# Patient Record
Sex: Male | Born: 1985 | Race: Black or African American | Hispanic: No | Marital: Single | Smoking: Current some day smoker
Health system: Southern US, Community
[De-identification: ages and names within clinical notes are randomized; demographics above are authoritative.]

## PROBLEM LIST (undated history)

## (undated) DIAGNOSIS — J45909 Unspecified asthma, uncomplicated: Secondary | ICD-10-CM

## (undated) DIAGNOSIS — I639 Cerebral infarction, unspecified: Secondary | ICD-10-CM

---
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
## 2004-05-20 ENCOUNTER — Emergency Department (HOSPITAL_COMMUNITY): Admission: EM | Admit: 2004-05-20 | Discharge: 2004-05-20 | Payer: Self-pay | Admitting: Emergency Medicine

## 2010-12-22 ENCOUNTER — Emergency Department (HOSPITAL_COMMUNITY)
Admission: EM | Admit: 2010-12-22 | Discharge: 2010-12-22 | Discharge status: Against Medical Advice | Payer: Self-pay | Attending: Emergency Medicine | Admitting: Emergency Medicine

## 2010-12-22 DIAGNOSIS — R51 Headache: Secondary | ICD-10-CM | POA: Insufficient documentation

## 2011-05-29 ENCOUNTER — Emergency Department (HOSPITAL_COMMUNITY)
Admission: EM | Admit: 2011-05-29 | Discharge: 2011-05-29 | Disposition: A | Discharge status: Home / Self Care | Payer: Self-pay | Attending: Emergency Medicine | Admitting: Emergency Medicine

## 2011-05-29 ENCOUNTER — Encounter: Payer: Self-pay | Admitting: *Deleted

## 2011-05-29 DIAGNOSIS — J111 Influenza due to unidentified influenza virus with other respiratory manifestations: Secondary | ICD-10-CM | POA: Insufficient documentation

## 2011-05-29 DIAGNOSIS — R059 Cough, unspecified: Secondary | ICD-10-CM | POA: Insufficient documentation

## 2011-05-29 DIAGNOSIS — R51 Headache: Secondary | ICD-10-CM | POA: Insufficient documentation

## 2011-05-29 DIAGNOSIS — R05 Cough: Secondary | ICD-10-CM | POA: Insufficient documentation

## 2011-05-29 DIAGNOSIS — R5383 Other fatigue: Secondary | ICD-10-CM | POA: Insufficient documentation

## 2011-05-29 DIAGNOSIS — R5381 Other malaise: Secondary | ICD-10-CM | POA: Insufficient documentation

## 2011-05-29 DIAGNOSIS — R112 Nausea with vomiting, unspecified: Secondary | ICD-10-CM | POA: Insufficient documentation

## 2011-05-29 DIAGNOSIS — R6883 Chills (without fever): Secondary | ICD-10-CM | POA: Insufficient documentation

## 2011-05-29 MED ORDER — ONDANSETRON 4 MG PO TBDP
4.0000 mg | ORAL_TABLET | Freq: Once | ORAL | Status: AC
  Administered 2011-05-29: 4 mg via ORAL
  Filled 2011-05-29: qty 1

## 2011-05-29 NOTE — ED Notes (Signed)
PT reports flu SX's today , N/V and weakness.

## 2011-05-29 NOTE — ED Notes (Signed)
Pt stated understanding of discharge instructions.

## 2011-05-29 NOTE — ED Provider Notes (Signed)
History     CSN: [id number]  Arrival date & time 05/29/11  1859   First MD Initiated Contact with Patient 05/29/11 2118      Chief Complaint  Patient presents with  . Influenza    N/V    (Consider location/radiation/quality/duration/timing/severity/associated sxs/prior treatment) HPI Comments: See down times daily, and rotation  Patient is a 26 y.o. male presenting with flu symptoms. The history is provided by the patient.  Influenza The current episode started in the past 7 days.    History reviewed. No pertinent past medical history.  History reviewed. No pertinent past surgical history.  History reviewed. No pertinent family history.  History  Substance Use Topics  . Smoking status: Not on file  . Smokeless tobacco: Not on file  . Alcohol Use: Not on file      Review of Systems  Allergies  Review of patient's allergies indicates no known allergies.  Home Medications   Current Outpatient Rx  Name Route Sig Dispense Refill  . ASPIRIN EC 81 MG PO TBEC Oral Take 324 mg by mouth once as needed. For pain       BP 124/78  Pulse 102  Temp(Src) 99.8 F (37.7 C) (Oral)  Resp 22  SpO2 96%  Physical Exam  ED Course  Procedures (including critical care time)  Labs Reviewed - No data to display No results found.   1. Influenza       MDM  Flu like sx        Arman Filter, NP 05/31/11 1439  Arman Filter, NP 05/31/11 1440

## 2011-05-29 NOTE — ED Notes (Signed)
Pt c/o flu like symptoms productive cough over the last week, multiple N/V today. Chills, and throbbing  HA

## 2011-06-01 NOTE — ED Provider Notes (Signed)
Medical screening examination/treatment/procedure(s) were performed by non-physician practitioner and as supervising physician I was immediately available for consultation/collaboration.    Efrat Zuidema L Torre [place], MD 06/01/11 0559 

## 2012-10-01 ENCOUNTER — Encounter (HOSPITAL_COMMUNITY): Payer: Self-pay | Admitting: Emergency Medicine

## 2012-10-01 ENCOUNTER — Emergency Department (HOSPITAL_COMMUNITY)
Admission: EM | Admit: 2012-10-01 | Discharge: 2012-10-01 | Disposition: A | Discharge status: Home / Self Care | Payer: Self-pay | Attending: Emergency Medicine | Admitting: Emergency Medicine

## 2012-10-01 ENCOUNTER — Emergency Department (HOSPITAL_COMMUNITY): Payer: Self-pay

## 2012-10-01 DIAGNOSIS — J45909 Unspecified asthma, uncomplicated: Secondary | ICD-10-CM | POA: Insufficient documentation

## 2012-10-01 DIAGNOSIS — S63259A Unspecified dislocation of unspecified finger, initial encounter: Secondary | ICD-10-CM | POA: Insufficient documentation

## 2012-10-01 DIAGNOSIS — W219XXA Striking against or struck by unspecified sports equipment, initial encounter: Secondary | ICD-10-CM | POA: Insufficient documentation

## 2012-10-01 DIAGNOSIS — F17200 Nicotine dependence, unspecified, uncomplicated: Secondary | ICD-10-CM | POA: Insufficient documentation

## 2012-10-01 DIAGNOSIS — Y9367 Activity, basketball: Secondary | ICD-10-CM | POA: Insufficient documentation

## 2012-10-01 DIAGNOSIS — Y929 Unspecified place or not applicable: Secondary | ICD-10-CM | POA: Insufficient documentation

## 2012-10-01 HISTORY — DX: Unspecified asthma, uncomplicated: J45.909

## 2012-10-01 MED ORDER — OXYCODONE-ACETAMINOPHEN 5-325 MG PO TABS: 1.0000 | ORAL_TABLET | Freq: Four times a day (QID) | ORAL | Status: DC | PRN

## 2012-10-01 NOTE — ED Notes (Signed)
Pt called in main ED waiting area with no response 

## 2012-10-01 NOTE — ED Notes (Signed)
Was playing basketball yesterday and injured  Rt pinky finger

## 2012-10-01 NOTE — ED Notes (Signed)
Pt st's he was playing basketball yesterday and ball hit his right little finger.  St's finger was out to side but he was able to get it back in place.  Now st's finger is swollen and painful.

## 2012-10-01 NOTE — Progress Notes (Signed)
Orthopedic Tech Progress Note Patient Details:  Robert Ibarra [birth year] [id number]  Ortho Devices Type of Ortho Device: Finger splint Ortho Device/Splint Location: RUE Ortho Device/Splint Interventions: Ordered;Application   [name] 10/01/2012, 4:56 PM

## 2012-10-01 NOTE — ED Provider Notes (Signed)
History  This chart was scribed for [employer]. Rubin Payor, MD by Ardeen Jourdain, ED Scribe. This patient was seen in room TR11C/TR11C and the patient's care was started at 1613.  CSN: [id number]  Arrival date & time 10/01/12  1426   First MD Initiated Contact with Patient 10/01/12 1613      Chief Complaint  Patient presents with  . Finger Injury     The history is provided by the patient. No language interpreter was used.    HPI Comments: Robert Ibarra is a 27 y.o. male who presents to the Emergency Department complaining of a finger injury that occurred yesterday. He states he has gradually worsening, constant pain to his right pinky finger. He states he was playing basketball when his "finger got stuck behind the ball." He states the finger has been "popping in and out of its socket." He denies any previous injury or similar symptoms. He denies any other symptoms at this time.   Past Medical History  Diagnosis Date  . Asthma     No past surgical history on file.  No family history on file.  History  Substance Use Topics  . Smoking status: Current Every Day Smoker  . Smokeless tobacco: Not on file  . Alcohol Use: Yes      Review of Systems  Musculoskeletal: Positive for arthralgias.       Right pinky pain  All other systems reviewed and are negative.    Allergies  Review of patient's allergies indicates no known allergies.  Home Medications   Current Outpatient Rx  Name  Route  Sig  Dispense  Refill  . acetaminophen (TYLENOL) 325 MG tablet   Oral   Take 650 mg by mouth once.         Marland Kitchen ibuprofen (ADVIL,MOTRIN) 200 MG tablet   Oral   Take 400 mg by mouth once.         Marland Kitchen oxyCODONE-acetaminophen (PERCOCET/ROXICET) 5-325 MG per tablet   Oral   Take 1-2 tablets by mouth every 6 (six) hours as needed for pain.   10 tablet   0     Triage Vitals: BP 112/67  Pulse 69  Temp(Src) 98.6 F (37 C)  Resp 16  SpO2 99%  Physical Exam  Nursing note and  vitals reviewed. Constitutional: He is oriented to person, place, and time. He appears well-developed and well-nourished. No distress.  HENT:  Head: Normocephalic and atraumatic.  Eyes: EOM are normal. Pupils are equal, round, and reactive to light.  Neck: Normal range of motion. Neck supple. No tracheal deviation present.  Cardiovascular: Normal rate.   Pulmonary/Chest: Effort normal. No respiratory distress.  Abdominal: Soft. He exhibits no distension.  Musculoskeletal: Normal range of motion. He exhibits no edema.  Laxity with medial flexion at PIP joint of right pinky finger. Sensation intact. Mild swelling to pinky finger. Normal capillary refill.   Neurological: He is alert and oriented to person, place, and time.  Skin: Skin is warm and dry.  Psychiatric: He has a normal mood and affect. His behavior is normal.    ED Course  Procedures (including critical care time)  4:17 PM-Discussed treatment plan which includes x-ray of right pinky finger and follow up with an orthopedist with pt at bedside and pt agreed to plan.    Labs Reviewed - No data to display Dg Finger Little Right  10/01/2012  *RADIOLOGY REPORT*  Clinical Data: Hit finger playing basketball, injury  RIGHT LITTLE FINGER 2+V  Comparison: None  Findings: Joint spaces preserved. Osseous mineralization normal. No acute fracture, dislocation or bone destruction.  IMPRESSION: No acute osseous abnormalities.   Original Report Authenticated By: Ulyses Southward, M.D.      1. Finger dislocation, initial encounter       MDM  Patient is finger injury. Likely was the dislocation. States that it has been planning or on direction and 1 point back to. X-ray does not show fracture. Patient was splinted and will followup with hand surgery.      I personally performed the services described in this documentation, which was scribed in my presence. The recorded information has been reviewed and is accurate.    Juliet Rude. Rubin Payor,  MD 10/01/12 2332

## 2013-08-13 ENCOUNTER — Encounter (HOSPITAL_COMMUNITY): Payer: Self-pay | Admitting: Emergency Medicine

## 2013-08-13 ENCOUNTER — Emergency Department (HOSPITAL_COMMUNITY): Payer: Self-pay

## 2013-08-13 ENCOUNTER — Emergency Department (HOSPITAL_COMMUNITY)
Admission: EM | Admit: 2013-08-13 | Discharge: 2013-08-13 | Disposition: A | Discharge status: Home / Self Care | Payer: Self-pay | Attending: Emergency Medicine | Admitting: Emergency Medicine

## 2013-08-13 DIAGNOSIS — F17200 Nicotine dependence, unspecified, uncomplicated: Secondary | ICD-10-CM | POA: Insufficient documentation

## 2013-08-13 DIAGNOSIS — S61212A Laceration without foreign body of right middle finger without damage to nail, initial encounter: Secondary | ICD-10-CM

## 2013-08-13 DIAGNOSIS — Z23 Encounter for immunization: Secondary | ICD-10-CM | POA: Insufficient documentation

## 2013-08-13 DIAGNOSIS — Y929 Unspecified place or not applicable: Secondary | ICD-10-CM | POA: Insufficient documentation

## 2013-08-13 DIAGNOSIS — W268XXA Contact with other sharp object(s), not elsewhere classified, initial encounter: Secondary | ICD-10-CM | POA: Insufficient documentation

## 2013-08-13 DIAGNOSIS — Y9389 Activity, other specified: Secondary | ICD-10-CM | POA: Insufficient documentation

## 2013-08-13 DIAGNOSIS — S61209A Unspecified open wound of unspecified finger without damage to nail, initial encounter: Secondary | ICD-10-CM | POA: Insufficient documentation

## 2013-08-13 DIAGNOSIS — J45909 Unspecified asthma, uncomplicated: Secondary | ICD-10-CM | POA: Insufficient documentation

## 2013-08-13 MED ORDER — TETANUS-DIPHTH-ACELL PERTUSSIS 5-2.5-18.5 LF-MCG/0.5 IM SUSP
0.5000 mL | Freq: Once | INTRAMUSCULAR | Status: AC
  Administered 2013-08-13: 0.5 mL via INTRAMUSCULAR
  Filled 2013-08-13: qty 0.5

## 2013-08-13 MED ORDER — IBUPROFEN 400 MG PO TABS
800.0000 mg | ORAL_TABLET | Freq: Once | ORAL | Status: AC
  Administered 2013-08-13: 800 mg via ORAL
  Filled 2013-08-13: qty 2

## 2013-08-13 NOTE — Discharge Instructions (Signed)
Keep wounds clean and dry  Apply antibiotic ointment  Return to the emergency department if you develop any changing/worsening condition, drainage, spreading redness/swelling, increased pain, fever or any other concerns (please read additional information regarding your condition below)    Laceration Care, Adult A laceration is a cut or lesion that goes through all layers of the skin and into the tissue just beneath the skin. TREATMENT  Some lacerations may not require closure. Some lacerations may not be able to be closed due to an increased risk of infection. It is important to see your caregiver as soon as possible after an injury to minimize the risk of infection and maximize the opportunity for successful closure. If closure is appropriate, pain medicines may be given, if needed. The wound will be cleaned to help prevent infection. Your caregiver will use stitches (sutures), staples, wound glue (adhesive), or skin adhesive strips to repair the laceration. These tools bring the skin edges together to allow for faster healing and a better cosmetic outcome. However, all wounds will heal with a scar. Once the wound has healed, scarring can be minimized by covering the wound with sunscreen during the day for 1 full year. HOME CARE INSTRUCTIONS  For sutures or staples:  Keep the wound clean and dry.  If you were given a bandage (dressing), you should change it at least once a day. Also, change the dressing if it becomes wet or dirty, or as directed by your caregiver.  Wash the wound with soap and water 2 times a day. Rinse the wound off with water to remove all soap. Pat the wound dry with a clean towel.  After cleaning, apply a thin layer of the antibiotic ointment as recommended by your caregiver. This will help prevent infection and keep the dressing from sticking.  You may shower as usual after the first 24 hours. Do not soak the wound in water until the sutures are removed.  Only take  over-the-counter or prescription medicines for pain, discomfort, or fever as directed by your caregiver.  Get your sutures or staples removed as directed by your caregiver. For skin adhesive strips:  Keep the wound clean and dry.  Do not get the skin adhesive strips wet. You may bathe carefully, using caution to keep the wound dry.  If the wound gets wet, pat it dry with a clean towel.  Skin adhesive strips will fall off on their own. You may trim the strips as the wound heals. Do not remove skin adhesive strips that are still stuck to the wound. They will fall off in time. For wound adhesive:  You may briefly wet your wound in the shower or bath. Do not soak or scrub the wound. Do not swim. Avoid periods of heavy perspiration until the skin adhesive has fallen off on its own. After showering or bathing, gently pat the wound dry with a clean towel.  Do not apply liquid medicine, cream medicine, or ointment medicine to your wound while the skin adhesive is in place. This may loosen the film before your wound is healed.  If a dressing is placed over the wound, be careful not to apply tape directly over the skin adhesive. This may cause the adhesive to be pulled off before the wound is healed.  Avoid prolonged exposure to sunlight or tanning lamps while the skin adhesive is in place. Exposure to ultraviolet light in the first year will darken the scar.  The skin adhesive will usually remain in place for  5 to 10 days, then naturally fall off the skin. Do not pick at the adhesive film. You may need a tetanus shot if:  You cannot remember when you had your last tetanus shot.  You have never had a tetanus shot. If you get a tetanus shot, your arm may swell, get red, and feel warm to the touch. This is common and not a problem. If you need a tetanus shot and you choose not to have one, there is a rare chance of getting tetanus. Sickness from tetanus can be serious. SEEK MEDICAL CARE IF:   You  have redness, swelling, or increasing pain in the wound.  You see a red line that goes away from the wound.  You have yellowish-white fluid (pus) coming from the wound.  You have a fever.  You notice a bad smell coming from the wound or dressing.  Your wound breaks open before or after sutures have been removed.  You notice something coming out of the wound such as wood or glass.  Your wound is on your hand or foot and you cannot move a finger or toe. SEEK IMMEDIATE MEDICAL CARE IF:   Your pain is not controlled with prescribed medicine.  You have severe swelling around the wound causing pain and numbness or a change in color in your arm, hand, leg, or foot.  Your wound splits open and starts bleeding.  You have worsening numbness, weakness, or loss of function of any joint around or beyond the wound.  You develop painful lumps near the wound or on the skin anywhere on your body. MAKE SURE YOU:   Understand these instructions.  Will watch your condition.  Will get help right away if you are not doing well or get worse. Document Released: 05/09/2005 Document Revised: 08/01/2011 Document Reviewed: 11/02/2010 [facility] Patient Information 2014 [place], [place].   Emergency Department Resource Guide 1) Find a Doctor and Pay Out of Pocket Although you won't have to find out who is covered by your insurance plan, it is a good idea to ask around and get recommendations. You will then need to call the office and see if the doctor you have chosen will accept you as a new patient and what types of options they offer for patients who are self-pay. Some doctors offer discounts or will set up payment plans for their patients who do not have insurance, but you will need to ask so you aren't surprised when you get to your appointment.  2) Contact Your Local Health Department Not all health departments have doctors that can see patients for sick visits, but many do, so it is worth a call to  see if yours does. If you don't know where your local health department is, you can check in your phone book. The CDC also has a tool to help you locate your state's health department, and many state websites also have listings of all of their local health departments.  3) Find a Walk-in Clinic If your illness is not likely to be very severe or complicated, you may want to try a walk in clinic. These are popping up all over the country in pharmacies, drugstores, and shopping centers. They're usually staffed by nurse practitioners or physician assistants that have been trained to treat common illnesses and complaints. They're usually fairly quick and inexpensive. However, if you have serious medical issues or chronic medical problems, these are probably not your best option.  No Primary Care Doctor: - Call Health Connect  at  [phone] - they can help you locate a primary care doctor that  accepts your insurance, provides certain services, etc. - Physician Referral Service- [phone]  Chronic Pain Problems: Organization         Address  Phone   Notes  [facility]  [phone] Patients need to be referred by their primary care doctor.   Medication Assistance: Organization         Address  Phone   Notes  [facility] Medication [facility] [address] [place] [place], [place] [address] [phone] --Must be a resident of [facility] -- Must have NO insurance coverage whatsoever (no Medicaid/ Medicare, etc.) -- The pt. MUST have a primary care doctor that directs their care regularly and follows them in the community   [facility]  [phone]   [employer]  [phone]    Agencies that provide inexpensive medical care: Organization         Address  Phone   Notes  [facility]  [phone]   [facility]    [phone]   [facility] [address] [place], [place] [address] [phone]   [facility] [address] [place], [place] [phone]   [facility]anned Parenthood    [phone]   [facility]    [phone]   [facility]  [address], [place] Phone:  [phone], Fax:  [phone] Hours of Operation:  9 am - 6 pm, M-F.  Also accepts Medicaid/Medicare and self-pay.  [facility]  [address], [place] Phone: [phone], Fax: [phone]. Hours of Operation:  8:30 am - 5:30 pm, M-F.  Also accepts Medicaid and self-pay.  [facility] [place] [address], [place] Phone: [phone]   [facility] [address] Natasha Bence [place], [place] [phone], Ext. [phone] Mondays & Thursdays: 7-9 AM.  First 15 patients are seen on a first come, first serve basis.    Medicaid-accepting [facility] Providers:  Organization         Address  Phone   Notes  [facility] [address], [place] [phone] Also accepts self-pay patients.  [facility] [address] Laurell Josephs [place], [place]  [phone]   [facility] [address], [place] [phone]   [facility] [address] [place] [phone]   Renaye Rakers [address], [place]   [phone] Only accepts [place] Access [place] patients after they have their name applied to their card.   Self-Pay (no insurance) in [facility]:  Organization         Address  Phone   Notes  Sickle Cell Patients, [facility] Medicine [address] [place], [place] [phone]   [facility] [address] [place], [place] [phone]   [facility] [place]  [address], [place] [phone]   [facility]/Dr. Osei-Bonsu  [address], [employer] or [address], [place] [phone] Phone number for both [place] and [place] locations is the same.  [facility] [address] Ginette Otto [phone]   [facility] [address],  or [address] [phone]   [facility] [address] [place], [place] [phone], phone; [phone], fax Sees patients 1st and 3rd Saturday of every month.  Must not qualify for public or private insurance (i.e. Medicaid, Medicare, [place] Health Choice, Veterans' Benefits)  Household income should be no more than 200% of the poverty level The clinic cannot treat you if you are pregnant or think you are pregnant  Sexually transmitted diseases are not treated at the clinic.    Dental Care: Organization         Address  Phone  Notes  [facility] Department of [facility] [address] [place], [place] [phone] Accepts children up to age 80 who are enrolled in [place] or [place] Health Choice; pregnant women with a Medicaid card; and children who have applied for Medicaid or [place] Health Choice, but were declined, whose parents can pay a reduced fee at time of service.  [facility] Department of [facility]  [address], [place] [phone] Accepts children up to age 74 who are enrolled in [place] or [place] Health Choice; pregnant women with a Medicaid card; and children who have applied for Medicaid or  Health Choice, but were declined, whose parents can pay a reduced fee at time of service.  [facility]  [address] [place], [place] [phone] Patients are seen by appointment only. Walk-ins are not accepted. [facility] will see patients 23 years of age and older. Monday - Tuesday (8am-5pm) Most Wednesdays (8:30-5pm) $30 per visit, cash only  [facility] Adult [facility] Access [facility]  [address], [facility] [phone] Patients are  seen by appointment only. Walk-ins are not accepted. [facility] will see patients 75 years of age and older. One Wednesday Evening (Monthly: Volunteer Based).  $30 per visit, cash only  [employer]  [phone] for adults; Children under age 90, call Graduate Pediatric Dentistry at [phone]. Children aged 61-14, please call [phone] to request a pediatric application.  Dental services are provided in all areas of dental care including fillings, crowns and bridges, complete and partial dentures, implants, gum treatment, root canals, and extractions. Preventive care is also provided. Treatment is provided to both adults and children. Patients are selected via a lottery and there is often a waiting list.   [facility] [address] [place]  [phone] [email or web address]   [facility] [address] [place], [place] [phone], Ext. [phone] Second and Fourth Thursday of each month, opens at 6:30 AM; Clinic ends at 9 AM.  Patients are seen on a first-come first-served basis, and a limited number are seen during each clinic.   [facility]  [address] Ether Griffins [place], [place] [phone]   Eligibility Requirements You must have lived in [place], [place], or [place] counties for at least the last three months.   You cannot be eligible for state or federal sponsored [place], including [employer], [place], or [employer].   You generally cannot be eligible for healthcare insurance through your employer.    How to apply: Eligibility screenings are held every Tuesday and Wednesday afternoon from 1:00 pm until 4:00 pm. You do not need an appointment for the interview!  [facility] [address], [place], [place] [phone]   [facility] Department  [phone]  [facility] Health Department  [phone]   [facility] Health Department  [phone]     Behavioral Health Resources in the Community: Intensive Outpatient Programs Organization         Address  Phone  Notes  [facility] [place] [facility] [address], [place], [place] [phone]   [facility] Outpatient [address] [place], [place] [phone]   [facility] [address] [place], [place]  [phone]   [facility] [address],  [place], [place] [phone] or [phone]   Substance Abuse Resources Organization         Address  Phone  Notes  [facility]  [phone]   [facility]  [phone]   [facility]  [phone]   Floydene Flock  [phone]   Residential & Outpatient Substance Abuse Program  [phone]   Psychological Services Organization         Address  Phone  Notes  [facility]  [phone]   [facility] Services  [phone]   [facility] Mental Health [address] [place] [phone] or [phone]    Mobile Crisis Teams Organization         Address  Phone  Notes  [facility], Mobile Crisis Care Unit  [phone]   [facility]  [address] [place], [place] [phone]   Doristine Locks [address] [place] [phone]    Self-Help/Support Groups Organization         Address  Phone             Notes  [facility] [place] - variety of support groups  336- I7437963 Call for more information  Narcotics Anonymous (NA), [facility] [address], [employer] [place]  2 meetings at this location   Statistician         Address  Phone  Notes  [facility] [address],    [place]  [phone]   [facility]  [address], [place] [address], [place], [place] [phone]   [facility] [address] [place], [place] [phone] Admissions: 8am-3pm M-F  Incentives  [facility] Treatment Center [address]    [address], [place] [phone]   [facility] [address] [place], [place], [place] [phone]   [facility] [address],  [place], [place] [phone]   [facility] - Intensive Outpatient [address] Laurell Joseph[address], [place], [place] [phone]   [facility] (Addiction [facility] [address] [place]  [place], [place] [phone] or [phone]   [facility] ([facility]) [address], [place], [place] [phone] Accepts Medicaid  [facility] [address]  [place] [phone] Substance Abuse/Addiction Treatment   [facility] Organization         Address  Phone  Notes  [facility]  [phone]   Angie Fava, PhD [address] Ervin Knack [place], [place]   [phone] or [phone]   [facility] Behavioral   [address] [place], [place] [phone]   [facility] [address], [place], [place] [phone] Insurance/Medicaid/sponsorship through [employer] and Families [address]  [place], [place] [phone] [place], [place] [phone]    Dr. Adele Schilder  [phone]   [facility] Dept. 1) [address] [place] 2) [place] 3)  [address], [place] [phone]  [phone]   [place] [phone] or [phone] (After Hours)

## 2013-08-13 NOTE — ED Notes (Signed)
J.P. At bedside, 2 sutures applied to finger.

## 2013-08-13 NOTE — ED Provider Notes (Signed)
CSN: [id number]     Arrival date & time 08/13/13  1345 History   None   This chart was scribed for Junius Argyle PA-C, a non-physician practitioner working with Donnetta Hutching, MD by Lewanda Rife, ED Scribe. This patient was seen in room TR10C/TR10C and the patient's care was started at 1:58 PM    Chief Complaint  Patient presents with  . Laceration    The history is provided by the patient. No language interpreter was used.   HPI Comments: Robert Ibarra is a 28 y.o. male with a PMH of asthma who presents to the Emergency Department complaining of 2 cm laceration of third digit on right hand onset PTA while trying to replace a light fixture. Light fixture broke with shattered glass. Patient states a piece of glass fell and lacerated his finger. No other injuries or lacerations. States bleeding is controlled with pressure dressing. Reports associated mild pain to site. Reports possible foreign body from light fixture in laceration, but denies removing anything from the wound. Reports using hydrogen peroxide on laceration. Reports pain is mildly exacerbated by touch and movement. Denies any alleviating factors. Denies associated other injuries, numbness, fever, and bleeding disorders. Reports unknown tetanus status.     Past Medical History  Diagnosis Date  . Asthma    No past surgical history on file. No family history on file. History  Substance Use Topics  . Smoking status: Current Every Day Smoker  . Smokeless tobacco: Not on file  . Alcohol Use: Yes    Review of Systems  Constitutional: Negative for fever.  Skin: Positive for wound.  Neurological: Negative for weakness and numbness.  Psychiatric/Behavioral: Negative for confusion.    Allergies  Review of patient's allergies indicates no known allergies.  Home Medications   Current Outpatient Rx  Name  Route  Sig  Dispense  Refill  . acetaminophen (TYLENOL) 325 MG tablet   Oral   Take 650 mg by mouth once.         Marland Kitchen  ibuprofen (ADVIL,MOTRIN) 200 MG tablet   Oral   Take 400 mg by mouth once.         Marland Kitchen oxyCODONE-acetaminophen (PERCOCET/ROXICET) 5-325 MG per tablet   Oral   Take 1-2 tablets by mouth every 6 (six) hours as needed for pain.   10 tablet   0    BP 100/61  Pulse 92  Temp(Src) 98.1 F (36.7 C) (Oral)  Ht 5\' 5"  (1.651 m)  Wt 140 lb (63.504 kg)  BMI 23.30 kg/m2  SpO2 99%  Filed Vitals:   08/13/13 1358  BP: 100/61  Pulse: 92  Temp: 98.1 F (36.7 C)  TempSrc: Oral  Height: 5\' 5"  (1.651 m)  Weight: 140 lb (63.504 kg)  SpO2: 99%    Physical Exam  Nursing note and vitals reviewed. Constitutional: He is oriented to person, place, and time. He appears well-developed and well-nourished. No distress.  HENT:  Head: Normocephalic and atraumatic.  Eyes: EOM are normal.  Neck: Neck supple. No tracheal deviation present.  Cardiovascular: Normal rate.   Cap refill < 3 seconds of affected extremity. Radial pulses present bilaterally.   Pulmonary/Chest: Effort normal. No respiratory distress.  Musculoskeletal: Normal range of motion. He exhibits tenderness. He exhibits no edema.       Hands: Focal tenderness to palpation to the right 3rd digit.   Neurological: He is alert and oriented to person, place, and time.  Sensation intact in the UE  Skin: Skin is warm  and dry. He is not diaphoretic.  2 cm laceration noted to the medial side of the PIP and middle phalanx of the right 3rd digit. Bleeding controlled. No visible foreign bodies. No limitations with ROM of the MP, PIP, and DIP in the digits of the right hand.   Psychiatric: He has a normal mood and affect. His behavior is normal.    ED Course  Procedures  COORDINATION OF CARE:  Nursing notes reviewed. Vital signs reviewed. Initial pt interview and examination performed.   2:31 PM-Discussed work up plan with pt at bedside, which includes x-ray of right middle finger. Pt agrees with plan.  LACERATION REPAIR Performed by: Junius ArgyleKatlin  Suhani Stillion PA-C Consent: Verbal consent obtained. Risks and benefits: risks, benefits and alternatives were discussed Patient identity confirmed: provided demographic data Time out performed prior to procedure Prepped and Draped in normal sterile fashion Wound explored Laceration Location: Dorsal middle phalanx of third digit on right hand  Laceration Length: 2 cm No Foreign Bodies seen or palpated Anesthesia: local infiltration Local anesthetic: lidocaine 2% without epinephrine Anesthetic total: 2 ml Irrigation method: syringe Amount of cleaning: standard Skin closure: 4-0 prolene Number of sutures or staples: 2 sutures Technique: simple interrupted  Patient tolerance: Patient tolerated the procedure well with no immediate complications. Bacitracin applied after procedure.   3:20 PM Nursing Notes Reviewed/ Care Coordinated Applicable Imaging Reviewed and incorporated into ED treatment Discussed results and treatment plan with pt. Pt demonstrates understanding and agrees with plan.   Treatment plan initiated: Medications  Tdap (BOOSTRIX) injection 0.5 mL (0.5 mLs Intramuscular Given 08/13/13 1435)  ibuprofen (ADVIL,MOTRIN) tablet 800 mg (800 mg Oral Given 08/13/13 1434)     Initial diagnostic testing ordered.     Labs Review Labs Reviewed - No data to display Imaging Review No results found.   EKG Interpretation None          DG Finger Middle Right (Final result)  Result time: 08/13/13 15:22:47    Final result by Rad Results In Interface (08/13/13 15:22:47)    Narrative:   CLINICAL DATA: Laceration  EXAM: RIGHT MIDDLE FINGER 2+V  COMPARISON: None available  FINDINGS: There is no evidence of fracture or dislocation. There is no evidence of arthropathy or other focal bone abnormality. Soft tissues are unremarkable. Known small laceration not definitely visualize. No radiopaque foreign body identified.  IMPRESSION: 1. No acute fracture dislocation. 2. No  radiopaque foreign body.   Electronically Signed By: Rise MuBenjamin McClintock M.D. On: 08/13/2013 15:22          MDM   Darden PalmerDonta Ibarra is a 28 y.o. male with a PMH of asthma who presents to the Emergency Department complaining of 2 cm laceration of third digit on right hand onset PTA while trying to replace a light fixture. Laceration repaired in the ED. X-rays negative for fracture or foreign body. No foreign body seen on exam. Patient neurovascularly intact. Tetanus booster given in the ED. Wound care discussed. Return precautions, discharge instructions, and follow-up was discussed with the patient before discharge.     There are no discharge medications for this patient.   Final impressions: 1. Laceration of middle finger of right hand without complication      Luiz IronJessica Katlin Sarim Rothman PA-C   I personally performed the services described in this documentation, which was scribed in my presence. The recorded information has been reviewed and is accurate.         Jillyn LedgerJessica K Mahi Zabriskie, PA-C 08/13/13 1625

## 2013-08-13 NOTE — ED Notes (Signed)
Laceration right middle finger while trying to replace a light fixture. Continues to ooze without pressure held.

## 2013-08-14 NOTE — ED Provider Notes (Signed)
Medical screening examination/treatment/procedure(s) were performed by non-physician practitioner and as supervising physician I was immediately available for consultation/collaboration.   EKG Interpretation None       Donnetta HutchingBrian Grisel Blumenstock, MD 08/14/13 1227

## 2014-05-17 IMAGING — CR DG FINGER MIDDLE 2+V*R*
3 series · 3 of 3 positions shown · non-contrast
Comparison: None available

CLINICAL DATA: Laceration

EXAM:
RIGHT MIDDLE FINGER 2+V

[x finger pa right *]
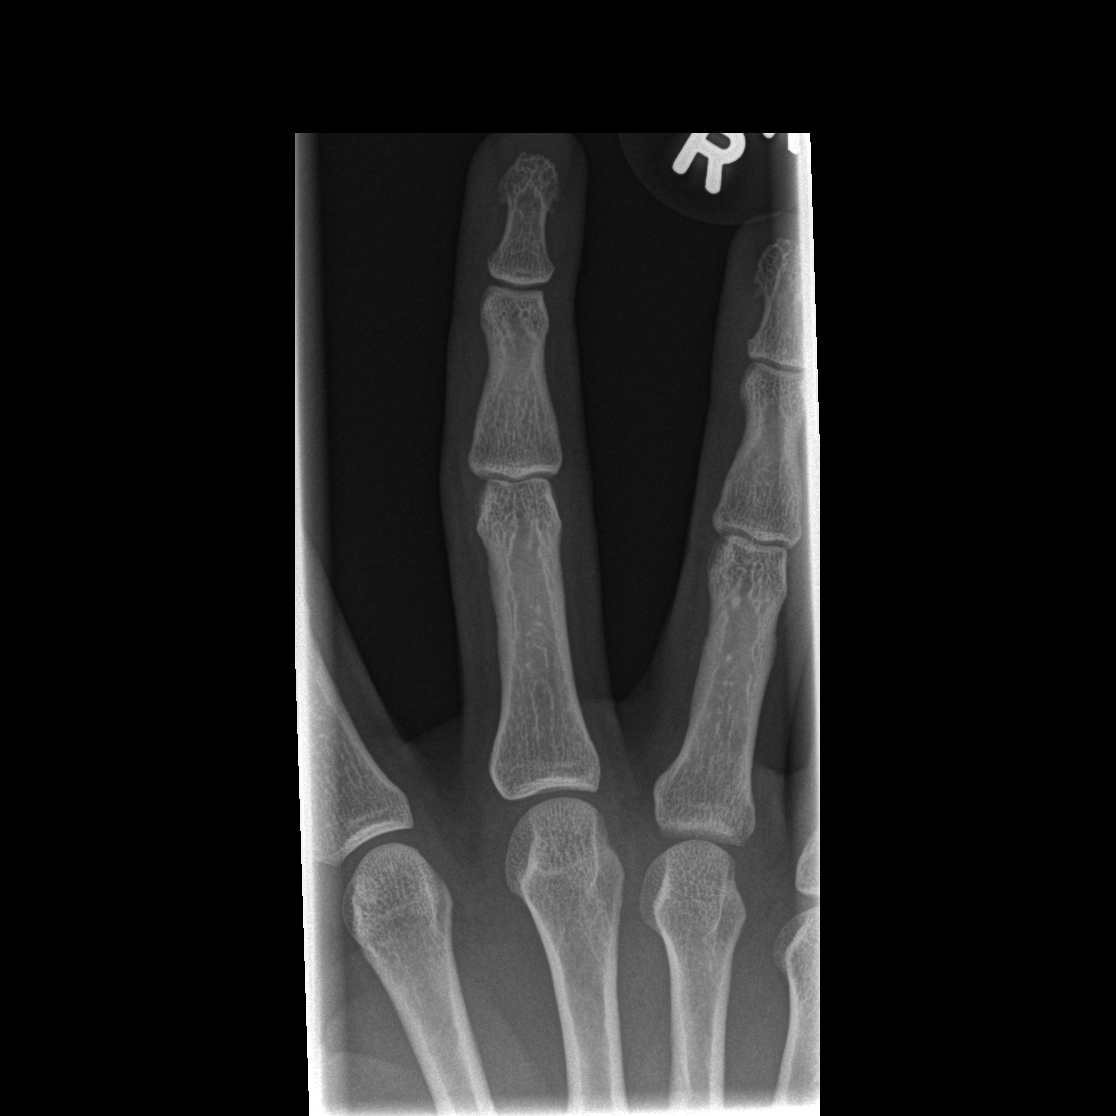

[x finger obl. right *]
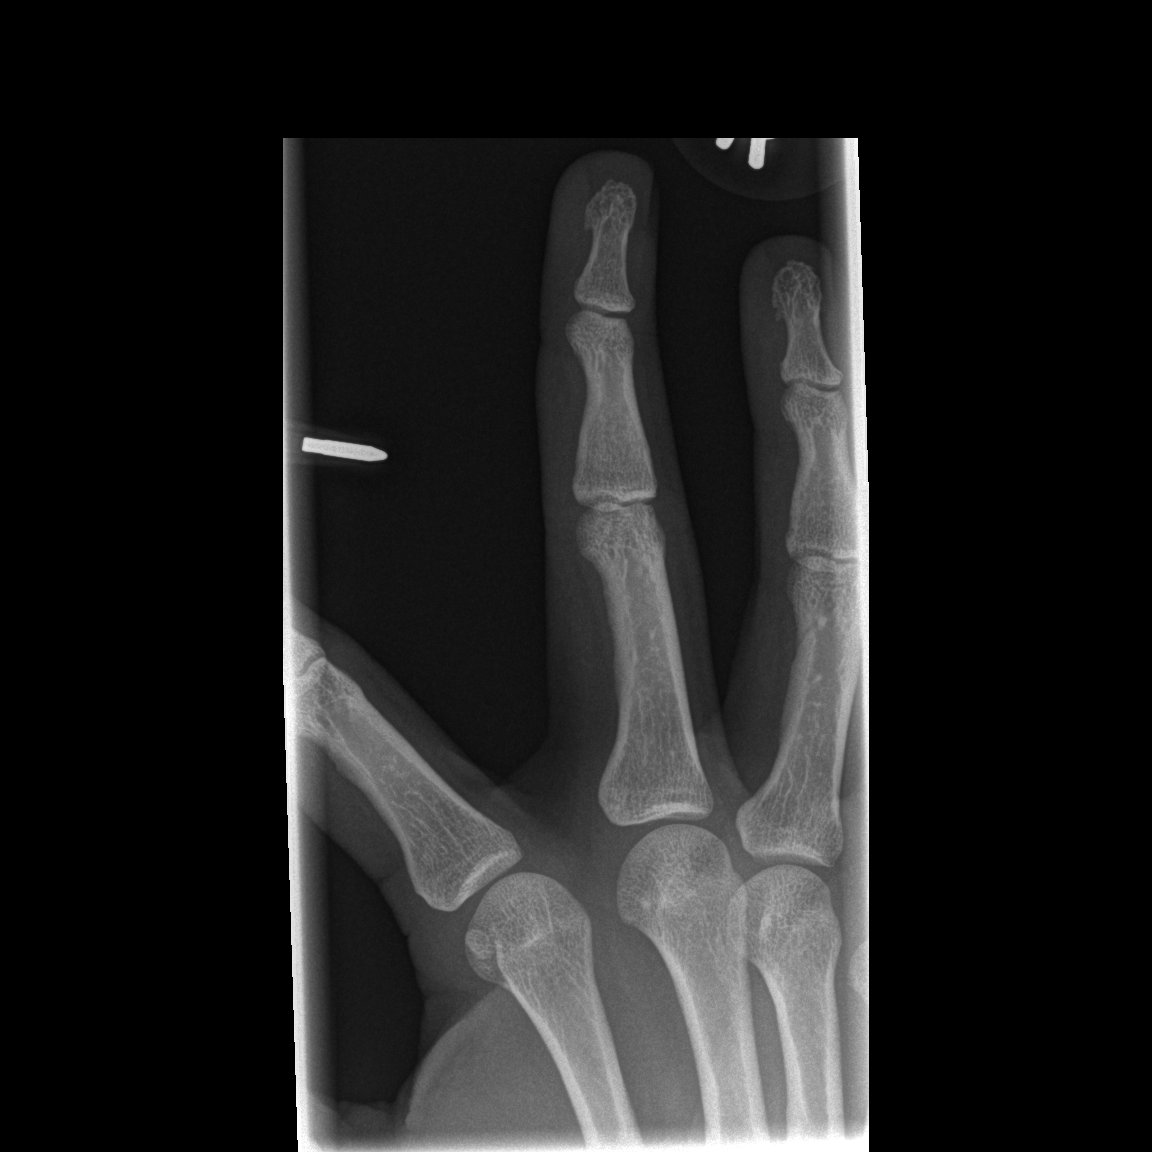

[x finger lateral right *]
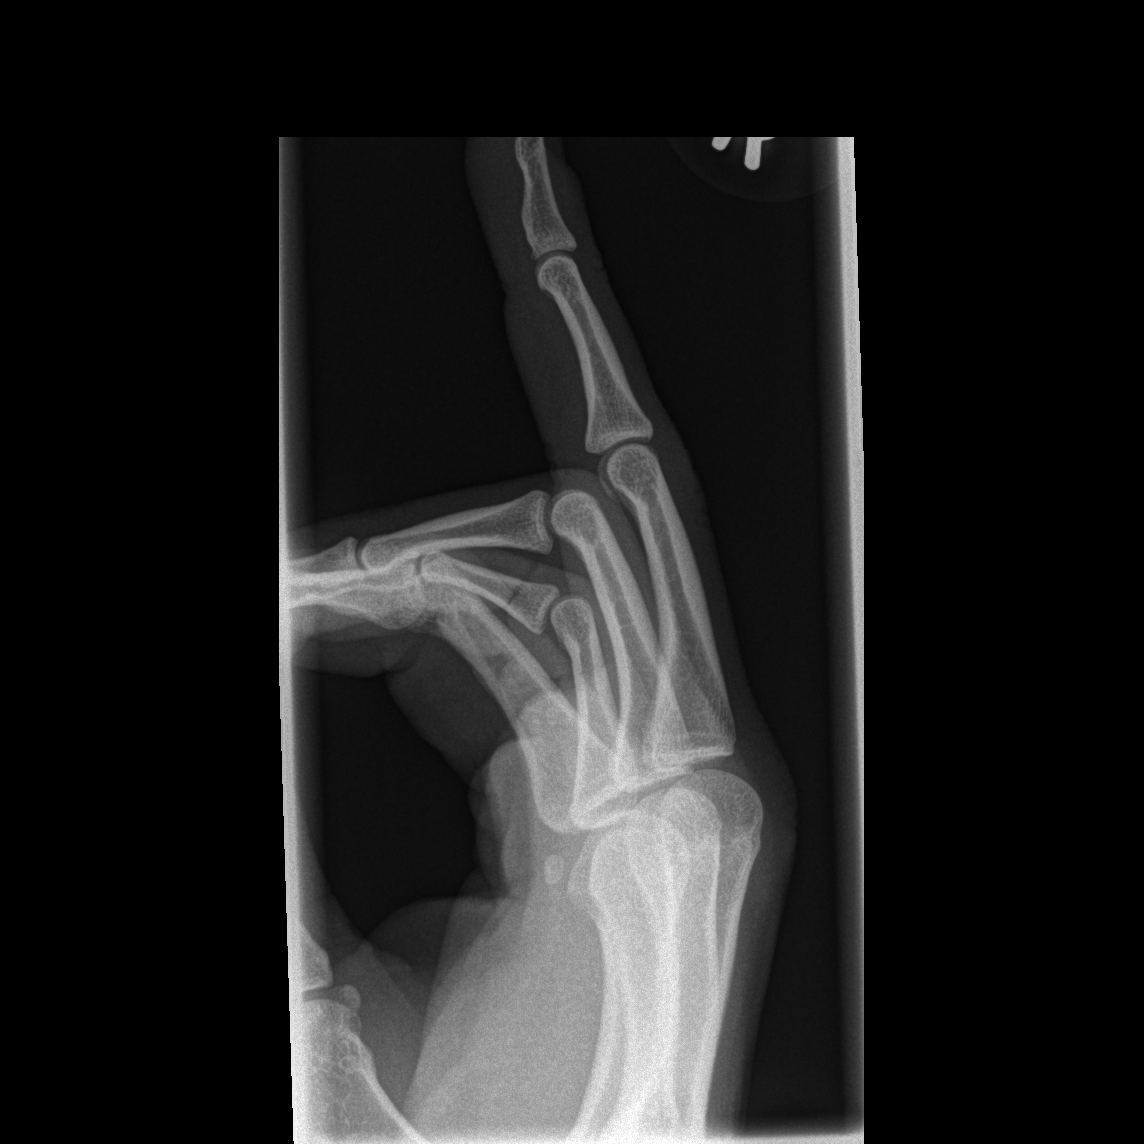

[3 of 3 positions shown; findings below may reference images not displayed]

FINDINGS: There is no evidence of fracture or dislocation. There is no
evidence of arthropathy or other focal bone abnormality. Soft
tissues are unremarkable. Known small laceration not definitely
visualize. No radiopaque foreign body identified.
IMPRESSION: 1. No acute fracture dislocation.
2. No radiopaque foreign body.

## 2015-11-02 ENCOUNTER — Emergency Department (HOSPITAL_COMMUNITY)
Admission: EM | Admit: 2015-11-02 | Discharge: 2015-11-02 | Disposition: A | Discharge status: Home / Self Care | Payer: Self-pay | Attending: Emergency Medicine | Admitting: Emergency Medicine

## 2015-11-02 ENCOUNTER — Encounter (HOSPITAL_COMMUNITY): Payer: Self-pay | Admitting: Emergency Medicine

## 2015-11-02 ENCOUNTER — Emergency Department (HOSPITAL_COMMUNITY): Payer: Self-pay

## 2015-11-02 DIAGNOSIS — F17210 Nicotine dependence, cigarettes, uncomplicated: Secondary | ICD-10-CM | POA: Insufficient documentation

## 2015-11-02 DIAGNOSIS — J45909 Unspecified asthma, uncomplicated: Secondary | ICD-10-CM | POA: Insufficient documentation

## 2015-11-02 DIAGNOSIS — Z8673 Personal history of transient ischemic attack (TIA), and cerebral infarction without residual deficits: Secondary | ICD-10-CM | POA: Insufficient documentation

## 2015-11-02 DIAGNOSIS — R200 Anesthesia of skin: Secondary | ICD-10-CM | POA: Insufficient documentation

## 2015-11-02 HISTORY — DX: Cerebral infarction, unspecified: I63.9

## 2015-11-02 LAB — BASIC METABOLIC PANEL
ANION GAP: 8 (ref 5–15)
BUN: 19 mg/dL (ref 6–20)
CALCIUM: 9.1 mg/dL (ref 8.9–10.3)
CO2: 29 mmol/L (ref 22–32)
CREATININE: 1.29 mg/dL — AB (ref 0.61–1.24)
Chloride: 100 mmol/L — ABNORMAL LOW (ref 101–111)
Glucose, Bld: 80 mg/dL (ref 65–99)
Potassium: 3.4 mmol/L — ABNORMAL LOW (ref 3.5–5.1)
SODIUM: 137 mmol/L (ref 135–145)

## 2015-11-02 LAB — CBC
HEMATOCRIT: 47 % (ref 39.0–52.0)
Hemoglobin: 15.4 g/dL (ref 13.0–17.0)
MCH: 29.6 pg (ref 26.0–34.0)
MCHC: 32.8 g/dL (ref 30.0–36.0)
MCV: 90.2 fL (ref 78.0–100.0)
Platelets: 276 10*3/uL (ref 150–400)
RBC: 5.21 MIL/uL (ref 4.22–5.81)
RDW: 13.4 % (ref 11.5–15.5)
WBC: 4.7 10*3/uL (ref 4.0–10.5)

## 2015-11-02 NOTE — ED Notes (Signed)
Pt states he has been having a numb/tingly feeling in his right arm since last night.  States it is getting better.  Denies weakness.

## 2015-11-02 NOTE — Discharge Instructions (Signed)
If you were given medicines take as directed.  If you are on coumadin or contraceptives realize their levels and effectiveness is altered by many different medicines.  If you have any reaction (rash, tongues swelling, other) to the medicines stop taking and see a physician.    If your blood pressure was elevated in the ER make sure you follow up for management with a primary doctor or return for chest pain, shortness of breath or stroke symptoms.  Please follow up as directed and return to the ER or see a physician for new or worsening symptoms.  Thank you. Filed Vitals:   11/02/15 1211 11/02/15 1230 11/02/15 1245 11/02/15 1357  BP: 117/66  106/63 109/68  Pulse: 83 96 98 76  Temp: 98.3 F (36.8 C)   98 F (36.7 C)  TempSrc: Oral   Oral  Resp: 18 26 14 18   Height: 5\' 5"  (1.651 m)     Weight: 135 lb (61.236 kg)     SpO2: 100% 94% 98% 98%

## 2015-11-02 NOTE — ED Provider Notes (Signed)
CSN: [id number]     Arrival date & time 11/02/15  1208 History   First MD Initiated Contact with Patient 11/02/15 1238     Chief Complaint  Patient presents with  . Numbness     (Consider location/radiation/quality/duration/timing/severity/associated sxs/prior Treatment) HPI Comments: 30 year old male with history of cigarette smoking no other significant medical problems except for possible stroke presents with right arm numbness. Since awakening at 3:00 this morning he's had numbness in the right arm no weakness. Patient was told while in jail that he had a small stroke unsure who told numbness, no MRI was done. No known heart problems or documented stroke. Patient currently is mild right arm numbness no neck pain. No neck surgery. No other neurologic symptoms.  The history is provided by the patient.    Past Medical History  Diagnosis Date  . Asthma   . Stroke [facility])    History reviewed. No pertinent past surgical history. History reviewed. No pertinent family history. Social History  Substance Use Topics  . Smoking status: Current Every Day Smoker -- 1.00 packs/day    Types: Cigarettes  . Smokeless tobacco: None  . Alcohol Use: Yes     Comment: weekly    Review of Systems  Constitutional: Negative for fever and chills.  HENT: Negative for congestion.   Eyes: Negative for visual disturbance.  Respiratory: Negative for shortness of breath.   Cardiovascular: Negative for chest pain.  Gastrointestinal: Negative for vomiting and abdominal pain.  Genitourinary: Negative for dysuria and flank pain.  Musculoskeletal: Negative for back pain, neck pain and neck stiffness.  Skin: Negative for rash.  Neurological: Positive for numbness. Negative for light-headedness and headaches.      Allergies  Review of patient's allergies indicates no known allergies.  Home Medications   Prior to Admission medications   Not on File   BP 109/68 mmHg  Pulse 76  Temp(Src) 98 F (36.7  C) (Oral)  Resp 18  Ht  (1.651 m)  Wt 135 lb (61.236 kg)  BMI 22.47 kg/m2  SpO2 98% Physical Exam  Constitutional: He is oriented to person, place, and time. He appears well-developed and well-nourished.  HENT:  Head: Normocephalic and atraumatic.  Eyes: Conjunctivae are normal. Right eye exhibits no discharge. Left eye exhibits no discharge.  Neck: Normal range of motion. Neck supple. No tracheal deviation present.  Cardiovascular: Normal rate and regular rhythm.   Pulmonary/Chest: Effort normal and breath sounds normal.  Abdominal: Soft. He exhibits no distension. There is no tenderness. There is no guarding.  Musculoskeletal: He exhibits no edema.  Neurological: He is alert and oriented to person, place, and time. GCS eye subscore is 4. GCS verbal subscore is 5. GCS motor subscore is 6.  Right arm numb compared to left. 5+ strength in UE and LE with f/e at major joints. Sensation to palpation intact in UE and LE. CNs 2-12 grossly intact.  EOMFI.  PERRL.   Finger nose and coordination intact bilateral.   Visual fields intact to finger testing. No nystagmus   Skin: Skin is warm. No rash noted.  Psychiatric: He has a normal mood and affect.  Nursing note and vitals reviewed.   ED Course  Procedures (including critical care time) Labs Review Labs Reviewed  BASIC METABOLIC PANEL - Abnormal; Notable for the following:    Potassium 3.4 (*)    Chloride 100 (*)    Creatinine, Ser 1.29 (*)    All other components within normal limits  CBC  Imaging Review Mr Brain Wo Contrast  11/02/2015  CLINICAL DATA:  RIGHT arm sensory symptoms which began 11/01/2015. Patient reports interval improvement. EXAM: MRI HEAD WITHOUT CONTRAST TECHNIQUE: Multiplanar, multiecho pulse sequences of the brain and surrounding structures were obtained without intravenous contrast. COMPARISON:  None. FINDINGS: No restricted diffusion to suggest acute or subacute stroke. No mass lesion, hydrocephalus,  hemorrhage, or extra-axial fluid. Normal cerebral volume.  No white matter disease. Pituitary, pineal, and cerebellar tonsils unremarkable. No upper cervical lesions. Flow voids are maintained Extracranial soft tissues appear unremarkable. IMPRESSION: Negative exam.  No cause is seen for the reported symptoms. Electronically Signed   By: Elsie StainJohn T Curnes M.D.   On: 11/02/2015 13:52   I have personally reviewed and evaluated these images and lab results as part of my medical decision-making.   EKG Interpretation   Date/Time:  Monday November 02 2015 12:18:29 EDT Ventricular Rate:  87 PR Interval:  129 QRS Duration: 91 QT Interval:  374 QTC Calculation: 450 R Axis:   73 Text Interpretation:  Sinus rhythm No old tracing to compare Confirmed by  KNAPP  MD-J, JON ([id number]) on 11/02/2015 12:20:11 PM      MDM   Final diagnoses:  Right arm numbness   Patient presents with right arm numbness persisted since this morning. Patient is told he had a small stroke in the past. Patient has persistent decreased sensation in the right arm versus the left. MRI brain performed no stroke. Discussed outpatient follow-up with a primary doctor.  Results and differential diagnosis were discussed with the patient/parent/guardian. Xrays were independently reviewed by myself.  Close follow up outpatient was discussed, comfortable with the plan.   Medications - No data to display  Filed Vitals:   11/02/15 1211 11/02/15 1230 11/02/15 1245 11/02/15 1357  BP: 117/66  106/63 109/68  Pulse: 83 96 98 76  Temp: 98.3 F (36.8 C)   98 F (36.7 C)  TempSrc: Oral   Oral  Resp: 18 26 14 18   Height: 5\' 5"  (1.651 m)     Weight: 135 lb (61.236 kg)     SpO2: 100% 94% 98% 98%    Final diagnoses:  Right arm numbness       Blane OharaJoshua Zuriel Yeaman, MD 11/02/15 1422

## 2016-02-23 ENCOUNTER — Emergency Department (HOSPITAL_COMMUNITY)
Admission: EM | Admit: 2016-02-23 | Discharge: 2016-02-23 | Disposition: A | Discharge status: Home / Self Care | Payer: Self-pay | Attending: Emergency Medicine | Admitting: Emergency Medicine

## 2016-02-23 ENCOUNTER — Encounter (HOSPITAL_COMMUNITY): Payer: Self-pay

## 2016-02-23 DIAGNOSIS — Z5321 Procedure and treatment not carried out due to patient leaving prior to being seen by health care provider: Secondary | ICD-10-CM | POA: Insufficient documentation

## 2016-02-23 DIAGNOSIS — K920 Hematemesis: Secondary | ICD-10-CM | POA: Insufficient documentation

## 2016-02-23 LAB — COMPREHENSIVE METABOLIC PANEL
ALBUMIN: 4.9 g/dL (ref 3.5–5.0)
ALK PHOS: 77 U/L (ref 38–126)
ALT: 24 U/L (ref 17–63)
ANION GAP: 9 (ref 5–15)
AST: 23 U/L (ref 15–41)
BILIRUBIN TOTAL: 1.1 mg/dL (ref 0.3–1.2)
BUN: 10 mg/dL (ref 6–20)
CALCIUM: 10.1 mg/dL (ref 8.9–10.3)
CO2: 29 mmol/L (ref 22–32)
Chloride: 102 mmol/L (ref 101–111)
Creatinine, Ser: 1.07 mg/dL (ref 0.61–1.24)
GFR calc Af Amer: >60 mL/min (ref >60)
GLUCOSE: 93 mg/dL (ref 65–99)
Potassium: 3.9 mmol/L (ref 3.5–5.1)
Sodium: 140 mmol/L (ref 135–145)
TOTAL PROTEIN: 8.7 g/dL — AB (ref 6.5–8.1)

## 2016-02-23 LAB — CBC
HCT: 47 % (ref 39.0–52.0)
Hemoglobin: 15.5 g/dL (ref 13.0–17.0)
MCH: 29.8 pg (ref 26.0–34.0)
MCHC: 33 g/dL (ref 30.0–36.0)
MCV: 90.2 fL (ref 78.0–100.0)
PLATELETS: 290 10*3/uL (ref 150–400)
RBC: 5.21 MIL/uL (ref 4.22–5.81)
RDW: 13.6 % (ref 11.5–15.5)
WBC: 5.7 10*3/uL (ref 4.0–10.5)

## 2016-02-23 LAB — LIPASE, BLOOD: Lipase: 22 U/L (ref 11–51)

## 2016-02-23 NOTE — ED Triage Notes (Signed)
Pt called for role call x 2, not in lobby.

## 2016-02-23 NOTE — ED Triage Notes (Signed)
Pt c/o generalized abdominal "burning" and subjective hematemesis x 1 week.  Pain score 7/10.  Pt has not taken anything for symptoms.

## 2016-06-02 ENCOUNTER — Encounter (HOSPITAL_COMMUNITY): Payer: Self-pay

## 2016-06-02 ENCOUNTER — Emergency Department (HOSPITAL_COMMUNITY)
Admission: EM | Admit: 2016-06-02 | Discharge: 2016-06-02 | Disposition: A | Discharge status: Home / Self Care | Payer: Self-pay | Attending: Emergency Medicine | Admitting: Emergency Medicine

## 2016-06-02 ENCOUNTER — Emergency Department (HOSPITAL_COMMUNITY): Payer: Self-pay

## 2016-06-02 DIAGNOSIS — R55 Syncope and collapse: Secondary | ICD-10-CM | POA: Insufficient documentation

## 2016-06-02 DIAGNOSIS — Z8673 Personal history of transient ischemic attack (TIA), and cerebral infarction without residual deficits: Secondary | ICD-10-CM | POA: Insufficient documentation

## 2016-06-02 DIAGNOSIS — J45909 Unspecified asthma, uncomplicated: Secondary | ICD-10-CM | POA: Insufficient documentation

## 2016-06-02 DIAGNOSIS — F17210 Nicotine dependence, cigarettes, uncomplicated: Secondary | ICD-10-CM | POA: Insufficient documentation

## 2016-06-02 LAB — CBC
HCT: 43.6 % (ref 39.0–52.0)
Hemoglobin: 14.4 g/dL (ref 13.0–17.0)
MCH: 28.7 pg (ref 26.0–34.0)
MCHC: 33 g/dL (ref 30.0–36.0)
MCV: 86.9 fL (ref 78.0–100.0)
PLATELETS: 244 10*3/uL (ref 150–400)
RBC: 5.02 MIL/uL (ref 4.22–5.81)
RDW: 13.6 % (ref 11.5–15.5)
WBC: 5.7 10*3/uL (ref 4.0–10.5)

## 2016-06-02 LAB — BASIC METABOLIC PANEL
Anion gap: 5 (ref 5–15)
BUN: 15 mg/dL (ref 6–20)
CO2: 30 mmol/L (ref 22–32)
CREATININE: 1 mg/dL (ref 0.61–1.24)
Calcium: 8.9 mg/dL (ref 8.9–10.3)
Chloride: 103 mmol/L (ref 101–111)
GFR calc Af Amer: >60 mL/min (ref >60)
GFR calc non Af Amer: >60 mL/min (ref >60)
GLUCOSE: 115 mg/dL — AB (ref 65–99)
Potassium: 4.3 mmol/L (ref 3.5–5.1)
Sodium: 138 mmol/L (ref 135–145)

## 2016-06-02 LAB — I-STAT TROPONIN, ED: Troponin i, poc: 0 ng/mL (ref 0.00–0.08)

## 2016-06-02 LAB — CBG MONITORING, ED: Glucose-Capillary: 95 mg/dL (ref 65–99)

## 2016-06-02 MED ORDER — SODIUM CHLORIDE 0.9 % IV BOLUS (SEPSIS)
1000.0000 mL | Freq: Once | INTRAVENOUS | Status: AC
  Administered 2016-06-02: 1000 mL via INTRAVENOUS

## 2016-06-02 MED ORDER — ONDANSETRON HCL 4 MG/2ML IJ SOLN
4.0000 mg | Freq: Once | INTRAMUSCULAR | Status: AC
  Administered 2016-06-02: 4 mg via INTRAVENOUS
  Filled 2016-06-02: qty 2

## 2016-06-02 NOTE — Discharge Instructions (Signed)
Please rest and drinking plenty of fluids You can use flonase for nasal congestion

## 2016-06-02 NOTE — ED Provider Notes (Signed)
[facility]-EMERGENCY DEPT Provider Note   CSN: [id number] Arrival date & time: 06/02/16  0944     History   Chief Complaint Chief Complaint  Patient presents with  . Loss of Consciousness    HPI Robert Ibarra is a 31 y.o. male who presents with syncope and chest pain. PMH significant for asthma and reported CVA. He states that over the past month he has had congestion and a cough. Over the past several days he has had generalized weakness. Today he was at his job [place] for [employer] [place] doing [employer]) and was using the hose to collect leaves when he felt lightheaded when he was walking. Next thing he knew he had people around him. Estimated LOC of 1 minute. He was wearing a hard hat. Currently reports mild nausea and centralized chest pain which is constant but worse with coughing and inspiration. The chest pain has been present for several weeks. Also reports intermittent fever and chills. Denies headache, vision changes, SOB, abdominal pain, vomiting, diarrhea, arthralgias.   HPI  Past Medical History:  Diagnosis Date  . Asthma   . Stroke [facility])     There are no active problems to display for this patient.   History reviewed. No pertinent surgical history.     Home Medications    Prior to Admission medications   Not on File    Family History Family History  Problem Relation Age of Onset  . Diabetes Father     Social History Social History  Substance Use Topics  . Smoking status: Current Some Day Smoker    Packs/day: 0.50    Types: Cigarettes  . Smokeless tobacco: Never Used  . Alcohol use Yes     Comment: occasionally     Allergies   Patient has no known allergies.   Review of Systems Review of Systems  Constitutional: Negative for chills and fever.  HENT: Positive for congestion.   Respiratory: Negative for shortness of breath.   Cardiovascular: Positive for chest pain.  Gastrointestinal: Positive for nausea. Negative for abdominal pain,  diarrhea and vomiting.  Neurological: Positive for syncope, weakness and light-headedness. Negative for headaches.  All other systems reviewed and are negative.    Physical Exam Updated Vital Signs BP 103/74 (BP Location: Right Arm)   Pulse 90   Temp 99 F (37.2 C) (Oral)   Resp 16   Ht 5\' 5"  (1.651 m)   Wt 59 kg   SpO2 98%   BMI 21.63 kg/m   Physical Exam  Constitutional: He is oriented to person, place, and time. He appears well-developed and well-nourished. No distress.  HENT:  Head: Normocephalic and atraumatic.  Eyes: Conjunctivae are normal. Pupils are equal, round, and reactive to light. Right eye exhibits no discharge. Left eye exhibits no discharge. No scleral icterus.  Neck: Normal range of motion.  Cardiovascular: Normal rate and regular rhythm.  Exam reveals no gallop and no friction rub.   No murmur heard. Pulmonary/Chest: Effort normal and breath sounds normal. No respiratory distress. He has no wheezes. He has no rales. He exhibits tenderness (Sternal tenderness).  Abdominal: Soft. Bowel sounds are normal. He exhibits no distension and no mass. There is no tenderness. There is no rebound and no guarding. No hernia.  Neurological: He is alert and oriented to person, place, and time.  Mental Status:  Alert, oriented, thought content appropriate, able to give a coherent history. Speech fluent without evidence of aphasia. Able to follow 2 step commands without difficulty.  Cranial Nerves:  II:  Peripheral visual fields grossly normal, pupils equal, round, reactive to light III,IV, VI: ptosis not present, extra-ocular motions intact bilaterally  V,VII: smile symmetric, facial light touch sensation equal VIII: hearing grossly normal to voice  X: uvula elevates symmetrically  XI: bilateral shoulder shrug symmetric and strong XII: midline tongue extension without fassiculations Motor:  Normal tone. 4/5 in upper and lower extremities bilaterally including strong and  equal grip strength and dorsiflexion/plantar flexion Sensory: Pinprick and light touch normal in all extremities.  Cerebellar: normal finger-to-nose with bilateral upper extremities Gait: normal gait and balance CV: distal pulses palpable throughout    Skin: Skin is warm and dry.  Psychiatric: He has a normal mood and affect. His behavior is normal.  Nursing note and vitals reviewed.    ED Treatments / Results  Labs (all labs ordered are listed, but only abnormal results are displayed) Labs Reviewed  BASIC METABOLIC PANEL - Abnormal; Notable for the following:       Result Value   Glucose, Bld 115 (*)    All other components within normal limits  CBC  CBG MONITORING, ED  I-STAT TROPOININ, ED    EKG  EKG Interpretation None       Radiology No results found.  Procedures Procedures (including critical care time)  Medications Ordered in ED Medications  sodium chloride 0.9 % bolus 1,000 mL (0 mLs Intravenous Stopped 06/02/16 1319)  ondansetron (ZOFRAN) injection 4 mg (4 mg Intravenous Given 06/02/16 1156)     Initial Impression / Assessment and Plan / ED Course  I have reviewed the triage vital signs and the nursing notes.  Pertinent labs & imaging results that were available during my care of the patient were reviewed by me and considered in my medical decision making (see chart for details).  Clinical Course    31 year old male presents with syncope. Likely vasovagal vs orthostatic. Patient is afebrile, not tachycardic or tachypneic, normotensive, and not hypoxic. Chest pain is easily reproduced with palpation. Chest pain work up is reassuring. EKG is NSR. CXR is negative. Initial troponin is 0. Labs are unremarkable. HEART score is 1. Pt has been monitored for several hours without any arrhythmias. Fluids and Zofran given. Pt requesting work note which was given. Patient is NAD, non-toxic, with stable VS. Patient is informed of clinical course, understands medical  decision making process, and agrees with plan. Opportunity for questions provided and all questions answered. Return precautions given.   Final Clinical Impressions(s) / ED Diagnoses   Final diagnoses:  Syncope and collapse    New Prescriptions New Prescriptions   No medications on file     Bethel Born, PA-C 06/03/16 1610    Shaune Pollack, MD 06/04/16 1201

## 2016-06-02 NOTE — ED Triage Notes (Signed)
Per EMS- Patient was at work and had a syncopal episode that lasted approx 1 minute. Patient had a hard hat on

## 2016-09-02 ENCOUNTER — Emergency Department (HOSPITAL_COMMUNITY)
Admission: EM | Admit: 2016-09-02 | Discharge: 2016-09-02 | Disposition: A | Discharge status: Home / Self Care | Payer: Self-pay | Attending: Emergency Medicine | Admitting: Emergency Medicine

## 2016-09-02 ENCOUNTER — Encounter (HOSPITAL_COMMUNITY): Payer: Self-pay

## 2016-09-02 ENCOUNTER — Emergency Department (HOSPITAL_COMMUNITY): Payer: Self-pay

## 2016-09-02 DIAGNOSIS — Z8673 Personal history of transient ischemic attack (TIA), and cerebral infarction without residual deficits: Secondary | ICD-10-CM | POA: Insufficient documentation

## 2016-09-02 DIAGNOSIS — J45909 Unspecified asthma, uncomplicated: Secondary | ICD-10-CM | POA: Insufficient documentation

## 2016-09-02 DIAGNOSIS — F17210 Nicotine dependence, cigarettes, uncomplicated: Secondary | ICD-10-CM | POA: Insufficient documentation

## 2016-09-02 DIAGNOSIS — R569 Unspecified convulsions: Secondary | ICD-10-CM | POA: Insufficient documentation

## 2016-09-02 LAB — BASIC METABOLIC PANEL
Anion gap: 16 — ABNORMAL HIGH (ref 5–15)
BUN: 8 mg/dL (ref 6–20)
CO2: 22 mmol/L (ref 22–32)
CREATININE: 0.97 mg/dL (ref 0.61–1.24)
Calcium: 9.7 mg/dL (ref 8.9–10.3)
Chloride: 102 mmol/L (ref 101–111)
GFR calc Af Amer: >60 mL/min (ref >60)
GFR calc non Af Amer: >60 mL/min (ref >60)
Glucose, Bld: 78 mg/dL (ref 65–99)
Potassium: 3.5 mmol/L (ref 3.5–5.1)
SODIUM: 140 mmol/L (ref 135–145)

## 2016-09-02 LAB — CBC
HCT: 45.8 % (ref 39.0–52.0)
Hemoglobin: 15.3 g/dL (ref 13.0–17.0)
MCH: 29.4 pg (ref 26.0–34.0)
MCHC: 33.4 g/dL (ref 30.0–36.0)
MCV: 88.1 fL (ref 78.0–100.0)
PLATELETS: 312 10*3/uL (ref 150–400)
RBC: 5.2 MIL/uL (ref 4.22–5.81)
RDW: 13.6 % (ref 11.5–15.5)
WBC: 11.1 10*3/uL — AB (ref 4.0–10.5)

## 2016-09-02 LAB — I-STAT CHEM 8, ED
BUN: 8 mg/dL (ref 6–20)
Calcium, Ion: 1.11 mmol/L — ABNORMAL LOW (ref 1.15–1.40)
Chloride: 103 mmol/L (ref 101–111)
Creatinine, Ser: 1 mg/dL (ref 0.61–1.24)
Glucose, Bld: 80 mg/dL (ref 65–99)
HCT: 49 % (ref 39.0–52.0)
Hemoglobin: 16.7 g/dL (ref 13.0–17.0)
Potassium: 3.5 mmol/L (ref 3.5–5.1)
Sodium: 140 mmol/L (ref 135–145)
TCO2: 24 mmol/L (ref 0–100)

## 2016-09-02 LAB — CBG MONITORING, ED: Glucose-Capillary: 67 mg/dL (ref 65–99)

## 2016-09-02 MED ORDER — PROCHLORPERAZINE EDISYLATE 5 MG/ML IJ SOLN
10.0000 mg | Freq: Once | INTRAMUSCULAR | Status: AC
  Administered 2016-09-02: 10 mg via INTRAVENOUS
  Filled 2016-09-02: qty 2

## 2016-09-02 MED ORDER — KETOROLAC TROMETHAMINE 30 MG/ML IJ SOLN
15.0000 mg | Freq: Once | INTRAMUSCULAR | Status: AC
  Administered 2016-09-02: 15 mg via INTRAVENOUS
  Filled 2016-09-02: qty 1

## 2016-09-02 NOTE — ED Notes (Signed)
Pt taken to CT.

## 2016-09-02 NOTE — ED Triage Notes (Addendum)
Per [facility], Pt is coming from home with complaints of a new onset of seizure today after marijuana use at 1100. Pt reports hx of using, but has never felt this way before. Pt was walking down the street when he felt like he was going to pass out. Pt laid down and was reports to have seizure like activity 30-45 seconds per sister. Pt was alert to voice and oriented x4 with EMS, but reported to be fatigued. Vitals per EMS: 144/70, 90 HR, 16 RR, 99% on RA, 106 CBG

## 2016-09-02 NOTE — ED Notes (Signed)
CBG 67 

## 2016-09-02 NOTE — Discharge Instructions (Signed)
No swimming, going to tall heights, driving or anything else where if you lose consciousness you could be severely injured or hurt someone else until you are cleared by neurology.  They should give you a call

## 2016-09-02 NOTE — ED Provider Notes (Signed)
[facility] Provider Note   CSN: [id number] Arrival date & time: 09/02/16  1245     History   Chief Complaint Chief Complaint  Patient presents with  . Seizures    HPI Robert Ibarra is a 31 y.o. male.  31 yo M with a chief complaint of new onset seizure. Per the family he was found on the ground and was shaking. Last for 3045 seconds. Generalized shaking. They're unsure if there is a prior head injury. He was doing a little drugs just prior to the event. Mildly confused upon awakening. Complaining of a headache. No prior history of seizure-like events.   The history is provided by the patient.  Seizures   This is a new problem. The current episode started less than 1 hour ago. The problem has not changed since onset.There was 1 seizure. The most recent episode lasted 30 to 120 seconds. Associated symptoms include headaches. Pertinent negatives include no confusion, no visual disturbance, no chest pain, no vomiting and no diarrhea. Characteristics include rhythmic jerking. The episode was not witnessed. The seizures did not continue in the ED. The seizure(s) had no focality.    Past Medical History:  Diagnosis Date  . Asthma   . Stroke [facility])     There are no active problems to display for this patient.   History reviewed. No pertinent surgical history.     Home Medications    Prior to Admission medications   Medication Sig Start Date End Date Taking? Authorizing Provider  DM-Phenylephrine-Acetaminophen (VICKS DAYQUIL COLD & FLU) 10-5-325 MG/15ML LIQD Take 2 capsules by mouth every 6 (six) hours as needed (cold symptoms).    Historical Provider, MD    Family History Family History  Problem Relation Age of Onset  . Diabetes Father     Social History Social History  Substance Use Topics  . Smoking status: Current Some Day Smoker    Packs/day: 0.50    Types: Cigarettes  . Smokeless tobacco: Never Used  . Alcohol use Yes     Comment: occasionally      Allergies   Patient has no known allergies.   Review of Systems Review of Systems  Constitutional: Negative for chills and fever.  HENT: Negative for congestion and facial swelling.   Eyes: Negative for discharge and visual disturbance.  Respiratory: Negative for shortness of breath.   Cardiovascular: Negative for chest pain and palpitations.  Gastrointestinal: Negative for abdominal pain, diarrhea and vomiting.  Musculoskeletal: Negative for arthralgias and myalgias.  Skin: Negative for color change and rash.  Neurological: Positive for seizures (x1 lasted about 30 sec) and headaches. Negative for tremors and syncope.  Psychiatric/Behavioral: Negative for confusion and dysphoric mood.     Physical Exam Updated Vital Signs BP 114/84 (BP Location: Right Arm)   Pulse 83   Temp 98.8 F (37.1 C) (Oral)   Resp (!) 22   SpO2 100%   Physical Exam  Constitutional: He is oriented to person, place, and time. He appears well-developed and well-nourished.  HENT:  Head: Normocephalic and atraumatic.  No signs of trauma to the head. Patient complaining of left occipital pain.  Eyes: EOM are normal. Pupils are equal, round, and reactive to light.  Neck: Normal range of motion. Neck supple. No JVD present.  Cardiovascular: Normal rate and regular rhythm.  Exam reveals no gallop and no friction rub.   No murmur heard. Pulmonary/Chest: No respiratory distress. He has no wheezes.  Abdominal: He exhibits no distension and no mass.  There is no tenderness. There is no rebound and no guarding.  Musculoskeletal: Normal range of motion.  Neurological: He is alert and oriented to person, place, and time.  Grossly neurologically intact. Appears sleepy on exam.  Skin: No rash noted. No pallor.  Psychiatric: He has a normal mood and affect. His behavior is normal.  Nursing note and vitals reviewed.    ED Treatments / Results  Labs (all labs ordered are listed, but only abnormal results  are displayed) Labs Reviewed  BASIC METABOLIC PANEL - Abnormal; Notable for the following:       Result Value   Anion gap 16 (*)    All other components within normal limits  CBC - Abnormal; Notable for the following:    WBC 11.1 (*)    All other components within normal limits  I-STAT CHEM 8, ED - Abnormal; Notable for the following:    Calcium, Ion 1.11 (*)    All other components within normal limits  CBG MONITORING, ED    EKG  EKG Interpretation  Date/Time:  Friday September 02 2016 12:55:09 EDT Ventricular Rate:  101 PR Interval:    QRS Duration: 83 QT Interval:  332 QTC Calculation: 431 R Axis:   66 Text Interpretation:  Sinus tachycardia Ventricular premature complex Aberrant conduction of SV complex(es) ST elev, probable normal early repol pattern No significant change since last tracing Confirmed by Khoi Hamberger MD, DANIEL [phone]) on 09/02/2016 1:12:03 PM       Radiology Ct Head Wo Contrast  Result Date: 09/02/2016 CLINICAL DATA:  New onset seizure today. EXAM: CT HEAD WITHOUT CONTRAST TECHNIQUE: Contiguous axial images were obtained from the base of the skull through the vertex without intravenous contrast. COMPARISON:  Brain MRI 11/02/2015. FINDINGS: Brain: Appears normal without hemorrhage, infarct, mass lesion, mass effect, midline shift or abnormal extra-axial fluid collection. No hydrocephalus or pneumocephalus. Vascular: Negative. 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 No acute abnormality is identified. Sinuses/Orbits: Scattered ethmoid air cell disease is seen. Mild mucosal thickening left maxillary sinus is noted. Other: None. IMPRESSION: No acute abnormality. Electronically Signed   By: Drusilla Kanner M.D.   On: 09/02/2016 15:03    Procedures Procedures (including critical care time)  Medications Ordered in ED Medications  prochlorperazine (COMPAZINE) injection 10 mg (10 mg Intravenous Given 09/02/16 1547)  ketorolac (TORADOL) 30 MG/ML  injection 15 mg (15 mg Intravenous Given 09/02/16 1547)     Initial Impression / Assessment and Plan / ED Course  I have reviewed the triage vital signs and the nursing notes.  Pertinent labs & imaging results that were available during my care of the patient were reviewed by me and considered in my medical decision making (see chart for details).     31 yo M With a chief complaint of a seizure. Patient appears sleepy on exam. Blood sugar is normal. I suspect this likely secondary to drug use. Will obtain a CT of the head since he's having a headache as well as a new onset seizure.  CT unremarkable.  Labs unremarkable.  Patient still a bit sleepy, complaining of headache, will treat with headache cocktail.  Likely d/c once awake and or family able to take him home.   The patients results and plan were reviewed and discussed.   Any x-rays performed were independently reviewed by myself.   Differential diagnosis were considered with the presenting HPI.  Medications  prochlorperazine (COMPAZINE) injection 10 mg (10 mg Intravenous Given 09/02/16 1547)  ketorolac (  TORADOL) 30 MG/ML injection 15 mg (15 mg Intravenous Given 09/02/16 1547)    Vitals:   09/02/16 1600 09/02/16 1630 09/02/16 1700 09/02/16 1753  BP: (!) 113/99 109/71 120/70 114/84  Pulse: 93 (!) 104 99 83  Resp: (!) 22  Temp:    98.8 F (37.1 C)  TempSrc:    Oral  SpO2: 96% 96% 97% 100%    Final diagnoses:  Seizure (HCC)  Seizure-like activity (HCC)      Final Clinical Impressions(s) / ED Diagnoses   Final diagnoses:  Seizure (HCC)  Seizure-like activity [facility])    New Prescriptions Discharge Medication List as of 09/02/2016  6:01 PM       Melene Plan, DO 09/02/16 1904

## 2016-09-02 NOTE — ED Notes (Signed)
Resident at the bedside

## 2018-04-02 ENCOUNTER — Ambulatory Visit (HOSPITAL_COMMUNITY)
Admission: EM | Admit: 2018-04-02 | Discharge: 2018-04-02 | Disposition: A | Discharge status: Home / Self Care | Payer: Self-pay | Attending: Family Medicine | Admitting: Family Medicine

## 2018-04-02 ENCOUNTER — Other Ambulatory Visit: Payer: Self-pay

## 2018-04-02 ENCOUNTER — Encounter (HOSPITAL_COMMUNITY): Payer: Self-pay

## 2018-04-02 DIAGNOSIS — J029 Acute pharyngitis, unspecified: Secondary | ICD-10-CM

## 2018-04-02 LAB — POCT RAPID STREP A: STREPTOCOCCUS, GROUP A SCREEN (DIRECT): NEGATIVE

## 2018-04-02 MED ORDER — DEXAMETHASONE SODIUM PHOSPHATE 10 MG/ML IJ SOLN
INTRAMUSCULAR | Status: AC
  Filled 2018-04-02: qty 1

## 2018-04-02 MED ORDER — DEXAMETHASONE SODIUM PHOSPHATE 10 MG/ML IJ SOLN
10.0000 mg | Freq: Once | INTRAMUSCULAR | Status: AC
  Administered 2018-04-02: 10 mg via INTRAMUSCULAR

## 2018-04-02 NOTE — Discharge Instructions (Addendum)
It was nice meeting you!!  Your rapid strep test was negative This is most likely a viral illness Steroid injection given in clinic Over-the-counter medications to help with symptoms Mucinex may be a good option Warm tea and honey can also help with the throat Follow up as needed for continued or worsening symptoms

## 2018-04-02 NOTE — ED Provider Notes (Signed)
[facility]    CSN: [id number] Arrival date & time: 04/02/18  1009     History   Chief Complaint Chief Complaint  Patient presents with  . Sore Throat    HPI Robert Ibarra is a 32 y.o. male.    URI  Presenting symptoms: congestion, cough, rhinorrhea and sore throat   Severity:  Moderate Duration:  4 days Timing:  Constant Progression:  Unchanged Chronicity:  New Relieved by:  Nothing Worsened by:  Nothing Associated symptoms: no arthralgias, no headaches, no myalgias, no neck pain, no sinus pain, no sneezing, no swollen glands and no wheezing   Risk factors: no immunosuppression, no recent illness, no recent travel and no sick contacts     Past Medical History:  Diagnosis Date  . Asthma   . Stroke [facility])     There are no active problems to display for this patient.   History reviewed. No pertinent surgical history.     Home Medications    Prior to Admission medications   Medication Sig Start Date End Date Taking? Authorizing Provider  DM-Phenylephrine-Acetaminophen (VICKS DAYQUIL COLD & FLU) 10-5-325 MG/15ML LIQD Take 2 capsules by mouth every 6 (six) hours as needed (cold symptoms).    [provider]    Family History Family History  Problem Relation Age of Onset  . Diabetes Father     Social History Social History   Tobacco Use  . Smoking status: Current Some Day Smoker    Packs/day: 0.50    Types: Cigarettes  . Smokeless tobacco: Never Used  Substance Use Topics  . Alcohol use: Yes    Comment: occasionally  . Drug use: Yes    Types: Marijuana    Comment: occasionally     Allergies   Patient has no known allergies.   Review of Systems Review of Systems  HENT: Positive for congestion, rhinorrhea and sore throat. Negative for sinus pain and sneezing.   Respiratory: Positive for cough. Negative for wheezing.   Musculoskeletal: Negative for arthralgias, myalgias and neck pain.  Neurological: Negative for headaches.      Physical Exam Triage Vital Signs ED Triage Vitals  Enc Vitals Group     BP 04/02/18 1021 124/69     Pulse Rate 04/02/18 1021 69     Resp 04/02/18 1021 18     Temp 04/02/18 1021 98.9 F (37.2 C)     Temp src --      SpO2 04/02/18 1021 100 %     Weight 04/02/18 1020 170 lb (77.1 kg)     Height --      Head Circumference --      Peak Flow --      Pain Score 04/02/18 1020 8     Pain Loc --      Pain Edu? --      Excl. in GC? --    No data found.  Updated Vital Signs BP 124/69 (BP Location: Right Arm)   Pulse 69   Temp 98.9 F (37.2 C)   Resp 18   Wt 170 lb (77.1 kg)   SpO2 100%   BMI 28.29 kg/m   Visual Acuity Right Eye Distance:   Left Eye Distance:   Bilateral Distance:    Right Eye Near:   Left Eye Near:    Bilateral Near:     Physical Exam  Constitutional: He appears well-developed and well-nourished.  Very pleasant. Non toxic or ill appearing.   HENT:  Head: Normocephalic and  atraumatic.  Right Ear: Hearing and tympanic membrane normal.  Left Ear: Hearing and tympanic membrane normal.  Mouth/Throat: Uvula is midline, oropharynx is clear and moist and mucous membranes are normal. No posterior oropharyngeal erythema or tonsillar abscesses. Tonsils are 1+ on the right. Tonsils are 1+ on the left.  Cardiovascular: Normal rate, regular rhythm and normal heart sounds.  Pulmonary/Chest: Effort normal and breath sounds normal.  Lungs clear in all fields. No dyspnea or distress. No retractions or nasal flaring.   Neurological: He is alert.  Skin: Skin is warm and dry.  Psychiatric: He has a normal mood and affect.  Nursing note and vitals reviewed.    UC Treatments / Results  Labs (all labs ordered are listed, but only abnormal results are displayed) Labs Reviewed  POCT RAPID STREP A    EKG None  Radiology No results found.  Procedures Procedures (including critical care time)  Medications Ordered in UC Medications  dexamethasone  (DECADRON) injection 10 mg (10 mg Intramuscular Given 04/02/18 1106)    Initial Impression / Assessment and Plan / UC Course  I have reviewed the triage vital signs and the nursing notes.  Pertinent labs & imaging results that were available during my care of the patient were reviewed by me and considered in my medical decision making (see chart for details).     Patient is a 32 year old male here with URI symptoms x4 days. Worst symptom is sore throat with laryngitis Patient unable to talk due to throat discomfort Rapid strep negative We will give steroid injection for throat inflammation Over-the-counter symptomatic treatment for cough, congestion with Mucinex Follow up as needed for continued or worsening symptoms  Final Clinical Impressions(s) / UC Diagnoses   Final diagnoses:  Viral pharyngitis     Discharge Instructions     It was nice meeting you!!  Your rapid strep test was negative This is most likely a viral illness Steroid injection given in clinic Over-the-counter medications to help with symptoms Mucinex may be a good option Warm tea and honey can also help with the throat Follow up as needed for continued or worsening symptoms     ED Prescriptions    None     Controlled Substance Prescriptions [place] Controlled Substance Registry consulted? Not Applicable   Janace Aris, NP 04/02/18 1126

## 2018-04-02 NOTE — ED Triage Notes (Signed)
Pt sore throat and body aches x 4 days

## 2018-12-16 ENCOUNTER — Emergency Department (HOSPITAL_COMMUNITY): Payer: Self-pay

## 2018-12-16 ENCOUNTER — Emergency Department (HOSPITAL_COMMUNITY)
Admission: EM | Admit: 2018-12-16 | Discharge: 2018-12-16 | Disposition: A | Discharge status: Home / Self Care | Payer: Self-pay | Attending: Emergency Medicine | Admitting: Emergency Medicine

## 2018-12-16 ENCOUNTER — Encounter (HOSPITAL_COMMUNITY): Payer: Self-pay | Admitting: Emergency Medicine

## 2018-12-16 DIAGNOSIS — Y9301 Activity, walking, marching and hiking: Secondary | ICD-10-CM | POA: Insufficient documentation

## 2018-12-16 DIAGNOSIS — Y929 Unspecified place or not applicable: Secondary | ICD-10-CM | POA: Insufficient documentation

## 2018-12-16 DIAGNOSIS — Y999 Unspecified external cause status: Secondary | ICD-10-CM | POA: Insufficient documentation

## 2018-12-16 DIAGNOSIS — S91331A Puncture wound without foreign body, right foot, initial encounter: Secondary | ICD-10-CM | POA: Insufficient documentation

## 2018-12-16 DIAGNOSIS — F1290 Cannabis use, unspecified, uncomplicated: Secondary | ICD-10-CM | POA: Insufficient documentation

## 2018-12-16 DIAGNOSIS — Z8709 Personal history of other diseases of the respiratory system: Secondary | ICD-10-CM | POA: Insufficient documentation

## 2018-12-16 DIAGNOSIS — F17210 Nicotine dependence, cigarettes, uncomplicated: Secondary | ICD-10-CM | POA: Insufficient documentation

## 2018-12-16 DIAGNOSIS — W3400XA Accidental discharge from unspecified firearms or gun, initial encounter: Secondary | ICD-10-CM | POA: Insufficient documentation

## 2018-12-16 MED ORDER — TRAMADOL HCL 50 MG PO TABS: 50.0000 mg | ORAL_TABLET | Freq: Four times a day (QID) | ORAL | 0 refills | Status: AC | PRN

## 2018-12-16 MED ORDER — CEFAZOLIN SODIUM-DEXTROSE 1-4 GM/50ML-% IV SOLN
1.0000 g | Freq: Once | INTRAVENOUS | Status: AC
  Administered 2018-12-16: 12:00:00 1 g via INTRAVENOUS
  Filled 2018-12-16: qty 50

## 2018-12-16 MED ORDER — MORPHINE SULFATE (PF) 4 MG/ML IV SOLN
4.0000 mg | Freq: Once | INTRAVENOUS | Status: AC
  Administered 2018-12-16: 12:00:00 4 mg via INTRAVENOUS
  Filled 2018-12-16: qty 1

## 2018-12-16 MED ORDER — CIPROFLOXACIN HCL 500 MG PO TABS
500.0000 mg | ORAL_TABLET | Freq: Once | ORAL | Status: AC
  Administered 2018-12-16: 500 mg via ORAL
  Filled 2018-12-16: qty 1

## 2018-12-16 MED ORDER — CIPROFLOXACIN HCL 500 MG PO TABS: 500.0000 mg | ORAL_TABLET | Freq: Two times a day (BID) | ORAL | 0 refills | Status: AC
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
## 2018-12-16 NOTE — Discharge Instructions (Addendum)
It was our pleasure to provide your ER care today - we hope that you feel better.  Elevate foot as much as possible for the next 3-4 days. Icepack to sore area. Keep wounds very clean and dry - wash with soap and water 2x/day. Change dressing 2x/day. Use crutches as need.   Take ibuprofen as need for pain. You may also take ultram as need for pain - no driving when taking.   Take antibiotic (cipro) as prescribed.   Return to ER if worse, new symptoms, fevers, increased swelling/spreading redness, pus from wounds, other concern.
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
## 2019-04-12 ENCOUNTER — Emergency Department (HOSPITAL_COMMUNITY)
Admission: EM | Admit: 2019-04-12 | Discharge: 2019-04-12 | Disposition: A | Discharge status: Home / Self Care | Payer: Self-pay | Attending: Emergency Medicine | Admitting: Emergency Medicine

## 2019-04-12 ENCOUNTER — Encounter (HOSPITAL_COMMUNITY): Payer: Self-pay | Admitting: Emergency Medicine

## 2019-04-12 ENCOUNTER — Other Ambulatory Visit: Payer: Self-pay

## 2019-04-12 DIAGNOSIS — F17210 Nicotine dependence, cigarettes, uncomplicated: Secondary | ICD-10-CM | POA: Insufficient documentation

## 2019-04-12 DIAGNOSIS — R110 Nausea: Secondary | ICD-10-CM | POA: Insufficient documentation

## 2019-04-12 DIAGNOSIS — Z20828 Contact with and (suspected) exposure to other viral communicable diseases: Secondary | ICD-10-CM | POA: Insufficient documentation

## 2019-04-12 DIAGNOSIS — Z20822 Contact with and (suspected) exposure to covid-19: Secondary | ICD-10-CM

## 2019-04-12 DIAGNOSIS — R0981 Nasal congestion: Secondary | ICD-10-CM | POA: Insufficient documentation

## 2019-04-12 DIAGNOSIS — R05 Cough: Secondary | ICD-10-CM | POA: Insufficient documentation

## 2019-04-12 DIAGNOSIS — M7918 Myalgia, other site: Secondary | ICD-10-CM | POA: Insufficient documentation

## 2019-04-12 DIAGNOSIS — R519 Headache, unspecified: Secondary | ICD-10-CM | POA: Insufficient documentation

## 2019-04-12 LAB — SARS CORONAVIRUS 2 (TAT 6-24 HRS): SARS Coronavirus 2: NEGATIVE

## 2019-04-12 MED ORDER — ONDANSETRON HCL 4 MG PO TABS: 4.0000 mg | ORAL_TABLET | Freq: Four times a day (QID) | ORAL | 0 refills | Status: AC

## 2019-04-12 MED ORDER — BENZONATATE 100 MG PO CAPS: 100.0000 mg | ORAL_CAPSULE | Freq: Three times a day (TID) | ORAL | 0 refills | Status: AC

## 2019-04-12 MED ORDER — ACETAMINOPHEN 500 MG PO TABS: 500.0000 mg | ORAL_TABLET | Freq: Four times a day (QID) | ORAL | 0 refills | Status: DC | PRN

## 2019-04-12 NOTE — ED Triage Notes (Signed)
Pt reports generalized body aches and HA since yesterday. Denies any sick contacts. Endorses N/V.

## 2019-04-12 NOTE — ED Provider Notes (Signed)
[facility]PART[facility] Provider Note   CSN: [id number] Arrival date & time: 04/12/19  1337     History   Chief Complaint Chief Complaint  Patient presents with  . Generalized Body Aches    HPI Robert Ibarra is a 33 y.o. male.     The history is provided by the patient. No language interpreter was used.     33 year old male with history of asthma presenting complaining of cold symptoms. Patient reports since yesterday he has had headache, sinus congestion, nonproductive cough, generalized body aches, back pain, nausea, 1 episodes of vomiting, decrease in appetite. Symptom is persistent, moderate in severity, pain is described as achy, 8 out of 10. Aside from rest no other specific treatment tried. He denies any wheezing. He denies any recent sick contact with anyone with COVID-19. He is a smoker.  Past Medical History:  Diagnosis Date  . Asthma     There are no active problems to display for this patient.   History reviewed. No pertinent surgical history.      Home Medications    Prior to Admission medications   Medication Sig Start Date End Date Taking? Authorizing Provider  ciprofloxacin (CIPRO) 500 MG tablet Take 1 tablet (500 mg total) by mouth 2 (two) times daily. 12/16/18   Lajean Saver, MD  DM-Phenylephrine-Acetaminophen (VICKS DAYQUIL COLD & FLU) 10-5-325 MG/15ML LIQD Take 2 capsules by mouth every 6 (six) hours as needed (cold symptoms).    [provider]  traMADol (ULTRAM) 50 MG tablet Take 1 tablet (50 mg total) by mouth every 6 (six) hours as needed. 12/16/18   Lajean Saver, MD    Family History Family History  Problem Relation Age of Onset  . Diabetes Father     Social History Social History   Tobacco Use  . Smoking status: Current Some Day Smoker    Packs/day: 0.50    Types: Cigarettes  . Smokeless tobacco: Never Used  Substance Use Topics  . Alcohol use: Yes    Comment: occasionally  . Drug use: Yes   Types: Marijuana    Comment: occasionally     Allergies   Patient has no known allergies.   Review of Systems Review of Systems  All other systems reviewed and are negative.    Physical Exam Updated Vital Signs BP 123/77 (BP Location: Left Arm)   Pulse 84   Temp 98.2 F (36.8 C) (Oral)   Resp 16   Ht 5\' 5"  (1.651 m)   Wt 67.6 kg   SpO2 98%   BMI 24.79 kg/m   Physical Exam Vitals signs and nursing note reviewed.  Constitutional:      General: He is not in acute distress.    Appearance: He is well-developed.  HENT:     Head: Atraumatic.  Eyes:     Conjunctiva/sclera: Conjunctivae normal.  Neck:     Musculoskeletal: Neck supple.  Cardiovascular:     Rate and Rhythm: Normal rate and regular rhythm.     Pulses: Normal pulses.     Heart sounds: Normal heart sounds.  Pulmonary:     Effort: Pulmonary effort is normal.     Breath sounds: Normal breath sounds. No wheezing, rhonchi or rales.  Abdominal:     Palpations: Abdomen is soft.     Tenderness: There is no abdominal tenderness.  Skin:    Findings: No rash.  Neurological:     Mental Status: He is alert and oriented to person, place, and  time.  Psychiatric:        Mood and Affect: Mood normal.      ED Treatments / Results  Labs (all labs ordered are listed, but only abnormal results are displayed) Labs Reviewed  SARS CORONAVIRUS 2 (TAT 6-24 HRS)    EKG None  Radiology No results found.  Procedures Procedures (including critical care time)  Medications Ordered in ED Medications - No data to display   Initial Impression / Assessment and Plan / ED Course  I have reviewed the triage vital signs and the nursing notes.  Pertinent labs & imaging results that were available during my care of the patient were reviewed by me and considered in my medical decision making (see chart for details).        BP 123/77 (BP Location: Left Arm)   Pulse 84   Temp 98.2 F (36.8 C) (Oral)   Resp 16   Ht  5\' 5"  (1.651 m)   Wt 67.6 kg   SpO2 98%   BMI 24.79 kg/m    Final Clinical Impressions(s) / ED Diagnoses   Final diagnoses:  Suspected COVID-19 virus infection    ED Discharge Orders         Ordered    benzonatate (TESSALON) 100 MG capsule  Every 8 hours     04/12/19 1510    ondansetron (ZOFRAN) 4 MG tablet  Every 6 hours     04/12/19 1510    acetaminophen (TYLENOL) 500 MG tablet  Every 6 hours PRN     04/12/19 1510         Robert Ibarra was evaluated in Emergency Department on 04/12/2019 for the symptoms described in the history of present illness. He was evaluated in the context of the global COVID-19 pandemic, which necessitated consideration that the patient might be at risk for infection with the SARS-CoV-2 virus that causes COVID-19. Institutional protocols and algorithms that pertain to the evaluation of patients at risk for COVID-19 are in a state of rapid change based on information released by regulatory bodies including the CDC and federal and state organizations. These policies and algorithms were followed during the patient's care in the ED.    04/14/2019, PA-C 04/12/19 1512    04/14/19, MD 04/13/19 2232

## 2019-09-17 ENCOUNTER — Other Ambulatory Visit: Payer: Self-pay

## 2019-09-17 ENCOUNTER — Emergency Department (HOSPITAL_COMMUNITY)
Admission: EM | Admit: 2019-09-17 | Discharge: 2019-09-17 | Disposition: A | Discharge status: Home / Self Care | Payer: Self-pay | Attending: Emergency Medicine | Admitting: Emergency Medicine

## 2019-09-17 ENCOUNTER — Encounter (HOSPITAL_COMMUNITY): Payer: Self-pay

## 2019-09-17 ENCOUNTER — Ambulatory Visit (HOSPITAL_COMMUNITY)
Admission: EM | Admit: 2019-09-17 | Discharge: 2019-09-17 | Disposition: A | Discharge status: Home / Self Care | Payer: Self-pay | Attending: Family Medicine | Admitting: Family Medicine

## 2019-09-17 DIAGNOSIS — S0501XA Injury of conjunctiva and corneal abrasion without foreign body, right eye, initial encounter: Secondary | ICD-10-CM

## 2019-09-17 DIAGNOSIS — R569 Unspecified convulsions: Secondary | ICD-10-CM | POA: Insufficient documentation

## 2019-09-17 DIAGNOSIS — Z5321 Procedure and treatment not carried out due to patient leaving prior to being seen by health care provider: Secondary | ICD-10-CM | POA: Insufficient documentation

## 2019-09-17 DIAGNOSIS — T1591XA Foreign body on external eye, part unspecified, right eye, initial encounter: Secondary | ICD-10-CM

## 2019-09-17 LAB — BASIC METABOLIC PANEL
Anion gap: 14 (ref 5–15)
BUN: 8 mg/dL (ref 6–20)
CO2: 25 mmol/L (ref 22–32)
Calcium: 9.3 mg/dL (ref 8.9–10.3)
Chloride: 102 mmol/L (ref 98–111)
Creatinine, Ser: 1.04 mg/dL (ref 0.61–1.24)
GFR calc Af Amer: >60 mL/min (ref >60)
GFR calc non Af Amer: >60 mL/min (ref >60)
Glucose, Bld: 98 mg/dL (ref 70–99)
Potassium: 3.5 mmol/L (ref 3.5–5.1)
Sodium: 141 mmol/L (ref 135–145)

## 2019-09-17 LAB — CBC
HCT: 47.7 % (ref 39.0–52.0)
Hemoglobin: 15.6 g/dL (ref 13.0–17.0)
MCH: 29.5 pg (ref 26.0–34.0)
MCHC: 32.7 g/dL (ref 30.0–36.0)
MCV: 90.3 fL (ref 80.0–100.0)
Platelets: 338 10*3/uL (ref 150–400)
RBC: 5.28 MIL/uL (ref 4.22–5.81)
RDW: 14.3 % (ref 11.5–15.5)
WBC: 7.5 10*3/uL (ref 4.0–10.5)
nRBC: 0 % (ref 0.0–0.2)

## 2019-09-17 LAB — CARBAMAZEPINE LEVEL, TOTAL: Carbamazepine Lvl: <2 ug/mL — ABNORMAL LOW (ref 4.0–12.0)

## 2019-09-17 MED ORDER — ERYTHROMYCIN 5 MG/GM OP OINT
TOPICAL_OINTMENT | OPHTHALMIC | Status: AC
  Filled 2019-09-17: qty 3.5

## 2019-09-17 MED ORDER — TETRACAINE HCL 0.5 % OP SOLN
OPHTHALMIC | Status: AC
  Filled 2019-09-17: qty 4

## 2019-09-17 MED ORDER — ERYTHROMYCIN 5 MG/GM OP OINT: TOPICAL_OINTMENT | Freq: Every day | OPHTHALMIC | Status: DC | PRN

## 2019-09-17 NOTE — ED Triage Notes (Signed)
Pt in after 3 seizures. Wife brought the pt, he arrived post-ictal, has reported hx of seizures and noncompliance with meds. Pt also c/o pain and "glass" in R eye, swollen and red

## 2019-09-17 NOTE — ED Triage Notes (Signed)
Patient reports he got something in his eye last night, possibly glass.

## 2019-09-17 NOTE — Discharge Instructions (Addendum)
[facility] [address]  [address] Be there at 2 pm for recheck of the eye.  I will give you some medication here to use as needed 4 to 5 times a day.

## 2019-09-17 NOTE — ED Notes (Signed)
Pt post-ictal in triage, wife comes to provide further details. She states pt had liquor shots last night, and she noticed glass in his R eye. He had reported seizures after the drinking episode. While in triage, after a few minutes of VS, bloodwork, triage questions, pt awoke to GCS 13, and stated he wanted to leave. Started to refuse an IV and further trx. Room made available in department, but pt and wife aggressively refused saying he no longer wanted trx. Pt and wife ambulated out to car

## 2019-09-17 NOTE — ED Provider Notes (Addendum)
[facility]    CSN: [id number] Arrival date & time: 09/17/19  1204      History   Chief Complaint Chief Complaint  Patient presents with  . Foreign Body in Eye    HPI Robert Ibarra is a 34 y.o. male.   Patient is a 34 year old male presents today with right eye injury.  This occurred last night.  Unsure of exact story but per wife patient fell and may have gotten a piece of glass in the right eye.  He has since had swelling to the eye, trouble opening the eye, blurred vision and tearing.  Feels like he still has foreign body in the eye.     Past Medical History:  Diagnosis Date  . Asthma     There are no problems to display for this patient.   History reviewed. No pertinent surgical history.     Home Medications    Prior to Admission medications   Medication Sig Start Date End Date Taking? Authorizing Provider  acetaminophen (TYLENOL) 500 MG tablet Take 1 tablet (500 mg total) by mouth every 6 (six) hours as needed. 04/12/19   Fayrene Helper, PA-C  benzonatate (TESSALON) 100 MG capsule Take 1 capsule (100 mg total) by mouth every 8 (eight) hours. 04/12/19   Fayrene Helper, PA-C  ciprofloxacin (CIPRO) 500 MG tablet Take 1 tablet (500 mg total) by mouth 2 (two) times daily. 12/16/18   Cathren Laine, MD  DM-Phenylephrine-Acetaminophen (VICKS DAYQUIL COLD & FLU) 10-5-325 MG/15ML LIQD Take 2 capsules by mouth every 6 (six) hours as needed (cold symptoms).    [provider]  ondansetron (ZOFRAN) 4 MG tablet Take 1 tablet (4 mg total) by mouth every 6 (six) hours. 04/12/19   Fayrene Helper, PA-C  traMADol (ULTRAM) 50 MG tablet Take 1 tablet (50 mg total) by mouth every 6 (six) hours as needed. 12/16/18   Cathren Laine, MD    Family History Family History  Problem Relation Age of Onset  . Diabetes Father     Social History Social History   Tobacco Use  . Smoking status: Current Some Day Smoker    Packs/day: 0.50    Types: Cigarettes  . Smokeless tobacco:  Never Used  Substance Use Topics  . Alcohol use: Yes    Comment: occasionally  . Drug use: Yes    Types: Marijuana    Comment: occasionally     Allergies   Patient has no known allergies.   Review of Systems Review of Systems   Physical Exam Triage Vital Signs ED Triage Vitals  Enc Vitals Group     BP 09/17/19 1224 129/84     Pulse Rate 09/17/19 1224 (!) 107     Resp 09/17/19 1224 16     Temp 09/17/19 1224 98.6 F (37 C)     Temp Source 09/17/19 1224 Oral     SpO2 09/17/19 1224 99 %     Weight --      Height --      Head Circumference --      Peak Flow --      Pain Score 09/17/19 1223 9     Pain Loc --      Pain Edu? --      Excl. in GC? --    No data found.  Updated Vital Signs BP 129/84 (BP Location: Right Arm)   Pulse (!) 107   Temp 98.6 F (37 C) (Oral)   Resp 16   SpO2 99%  Visual Acuity Right Eye Distance:   Left Eye Distance:   Bilateral Distance:    Right Eye Near:   Left Eye Near:    Bilateral Near:     Physical Exam Vitals and nursing note reviewed.  Constitutional:      Appearance: Normal appearance.  HENT:     Head: Normocephalic and atraumatic.     Nose: Nose normal.  Eyes:     General:        Right eye: Discharge present.     Conjunctiva/sclera: Conjunctivae normal.     Comments: Moderate right scleral injection, more to right upper.  Significant tearing.  Right upper lid swelling with bruising.  Corneal abrasion to 12:00 See picture for detail chemosis  Pulmonary:     Effort: Pulmonary effort is normal.  Musculoskeletal:        General: Normal range of motion.     Cervical back: Normal range of motion.  Skin:    General: Skin is warm and dry.  Neurological:     Mental Status: He is alert.  Psychiatric:        Mood and Affect: Mood normal.          UC Treatments / Results  Labs (all labs ordered are listed, but only abnormal results are displayed) Labs Reviewed - No data to display  EKG   Radiology No  results found.  Procedures Procedures (including critical care time)  Medications Ordered in UC Medications  erythromycin ophthalmic ointment ( Right Eye Given by Other 09/17/19 1344)    Initial Impression / Assessment and Plan / UC Course  I have reviewed the triage vital signs and the nursing notes.  Pertinent labs & imaging results that were available during my care of the patient were reviewed by me and considered in my medical decision making (see chart for details).     Corneal abrasion and possible foreign body in right eye. Spoke with ophthalmology on-call and they will see him today at 2:00 in office for evaluation. Patient heading over there after discharge. Prescribed erythromycin ointment to use, explained this may change once he is seen by specialist Wife taking him to the specialist.  Final Clinical Impressions(s) / UC Diagnoses   Final diagnoses:  Abrasion of right cornea, initial encounter  Foreign body of right eye, initial encounter     Discharge Instructions     [facility] [address] [address] Be there at 2 pm for recheck of the eye.  I will give you some medication here to use as needed 4 to 5 times a day.     ED Prescriptions    None     PDMP not reviewed this encounter.   Orvan July, NP 09/17/19 1350    Loura Halt A, NP 09/17/19 1352

## 2020-04-27 ENCOUNTER — Emergency Department (HOSPITAL_COMMUNITY)
Admission: EM | Admit: 2020-04-27 | Discharge: 2020-04-27 | Disposition: A | Discharge status: Home / Self Care | Payer: Self-pay | Attending: Emergency Medicine | Admitting: Emergency Medicine

## 2020-04-27 ENCOUNTER — Other Ambulatory Visit: Payer: Self-pay

## 2020-04-27 ENCOUNTER — Encounter (HOSPITAL_COMMUNITY): Payer: Self-pay

## 2020-04-27 DIAGNOSIS — K0889 Other specified disorders of teeth and supporting structures: Secondary | ICD-10-CM | POA: Insufficient documentation

## 2020-04-27 DIAGNOSIS — Z5321 Procedure and treatment not carried out due to patient leaving prior to being seen by health care provider: Secondary | ICD-10-CM | POA: Insufficient documentation

## 2020-04-27 DIAGNOSIS — J45909 Unspecified asthma, uncomplicated: Secondary | ICD-10-CM | POA: Insufficient documentation

## 2020-04-27 DIAGNOSIS — F17210 Nicotine dependence, cigarettes, uncomplicated: Secondary | ICD-10-CM | POA: Insufficient documentation

## 2020-04-27 MED ORDER — KETOROLAC TROMETHAMINE 30 MG/ML IJ SOLN
30.0000 mg | Freq: Once | INTRAMUSCULAR | Status: AC
  Administered 2020-04-27: 30 mg via INTRAMUSCULAR
  Filled 2020-04-27: qty 1

## 2020-04-27 MED ORDER — PENICILLIN V POTASSIUM 500 MG PO TABS
500.0000 mg | ORAL_TABLET | Freq: Once | ORAL | Status: AC
  Administered 2020-04-27: 500 mg via ORAL
  Filled 2020-04-27: qty 1

## 2020-04-27 MED ORDER — IBUPROFEN 600 MG PO TABS: 600.0000 mg | ORAL_TABLET | Freq: Four times a day (QID) | ORAL | 0 refills | Status: AC | PRN

## 2020-04-27 MED ORDER — PENICILLIN V POTASSIUM 500 MG PO TABS: 500.0000 mg | ORAL_TABLET | Freq: Four times a day (QID) | ORAL | 0 refills | Status: AC

## 2020-04-27 NOTE — Discharge Instructions (Addendum)
You will need to follow up with a dentist for further management of dental pain. Take medications as prescribed. Cover the tooth with dental wax as discussed when smoking.

## 2020-04-27 NOTE — ED Provider Notes (Signed)
[facility]ER[facility]ENC[facility]PT Provider Note   CSN: [id number] Arrival date & time: 04/27/20  0358     History Chief Complaint  Patient presents with  . Dental Pain    Robert Ibarra is a 34 y.o. male.  Patient to ED with complaint of dental pain in right lower molar x "weeks", that became intense pain last night keeping him from being able to sleep. He has been taking BC powder for pain but this did not provide relief tonight. No facial swelling or fever. No difficulty swallowing. He reports sharp right ear pain tonight as well.   The history is provided by the patient. No language interpreter was used.  Dental Pain Associated symptoms: no facial swelling, no fever, no headaches and no neck pain        Past Medical History:  Diagnosis Date  . Asthma     There are no problems to display for this patient.   History reviewed. No pertinent surgical history.     Family History  Problem Relation Age of Onset  . Diabetes Father     Social History   Tobacco Use  . Smoking status: Current Some Day Smoker    Packs/day: 0.50    Types: Cigarettes  . Smokeless tobacco: Never Used  Substance Use Topics  . Alcohol use: Yes    Comment: occasionally  . Drug use: Yes    Types: Marijuana    Comment: occasionally    Home Medications Prior to Admission medications   Medication Sig Start Date End Date Taking? Authorizing Provider  acetaminophen (TYLENOL) 500 MG tablet Take 1 tablet (500 mg total) by mouth every 6 (six) hours as needed. 04/12/19   Fayrene Helper, PA-C  benzonatate (TESSALON) 100 MG capsule Take 1 capsule (100 mg total) by mouth every 8 (eight) hours. 04/12/19   Fayrene Helper, PA-C  ciprofloxacin (CIPRO) 500 MG tablet Take 1 tablet (500 mg total) by mouth 2 (two) times daily. 12/16/18   Cathren Laine, MD  DM-Phenylephrine-Acetaminophen (VICKS DAYQUIL COLD & FLU) 10-5-325 MG/15ML LIQD Take 2 capsules by mouth every 6 (six) hours as needed (cold  symptoms).    [provider]  ondansetron (ZOFRAN) 4 MG tablet Take 1 tablet (4 mg total) by mouth every 6 (six) hours. 04/12/19   Fayrene Helper, PA-C  traMADol (ULTRAM) 50 MG tablet Take 1 tablet (50 mg total) by mouth every 6 (six) hours as needed. 12/16/18   Cathren Laine, MD    Allergies    Patient has no known allergies.  Review of Systems   Review of Systems  Constitutional: Negative for chills and fever.  HENT: Positive for dental problem and ear pain. Negative for facial swelling, sore throat and trouble swallowing.        Toothache.  Gastrointestinal: Negative.  Negative for nausea.  Musculoskeletal: Negative for myalgias, neck pain and neck stiffness.  Skin: Negative for color change.  Neurological: Negative.  Negative for headaches.    Physical Exam Updated Vital Signs BP 114/65 (BP Location: Right Arm)   Pulse 96   Temp 98.1 F (36.7 C) (Oral)   Resp 18   Ht 5\' 5"  (1.651 m)   Wt 68 kg   SpO2 100%   BMI 24.96 kg/m   Physical Exam Vitals and nursing note reviewed.  Constitutional:      Appearance: He is well-developed.  HENT:     Head:     Comments: No facial swelling.    Mouth/Throat:  Comments: There are several molars absent bilaterally. #30 has filling in place. There is dental tenderness to this tooth.  No dental abscess visualized.  Pulmonary:     Effort: Pulmonary effort is normal.  Musculoskeletal:        General: Normal range of motion.     Cervical back: Normal range of motion.  Lymphadenopathy:     Cervical: No cervical adenopathy.  Skin:    General: Skin is warm and dry.  Neurological:     Mental Status: He is alert and oriented to person, place, and time.     ED Results / Procedures / Treatments   Labs (all labs ordered are listed, but only abnormal results are displayed) Labs Reviewed - No data to display  EKG None  Radiology No results found.  Procedures Procedures (including critical care time)  Medications  Ordered in ED Medications - No data to display  ED Course  I have reviewed the triage vital signs and the nursing notes.  Pertinent labs & imaging results that were available during my care of the patient were reviewed by me and considered in my medical decision making (see chart for details).    MDM Rules/Calculators/A&P                          Patient to ED with dental pain as per HPI.   There is no abscess visualized. Suspect deep dental carie vs fracture vs apical abscess.   Will start PCN. IM Toradol provided for pain. Will discharge with Rx PCN and ibuprofen 600 mg. Will provide dental resources.   Final Clinical Impression(s) / ED Diagnoses Final diagnoses:  None   1. Dental pain  Rx / DC Orders ED Discharge Orders    None       Elpidio Anis, PA-C 04/27/20 0536    Nira Conn, MD 04/27/20 [phone]

## 2020-04-27 NOTE — ED Triage Notes (Signed)
Pt here from home with c/o right side  Dental pain , also c/o earache

## 2020-04-27 NOTE — ED Triage Notes (Signed)
Patient arrived with complaint of a cracked tooth on the right side. Reports taking a BC powder roughly 3 hours ago.

## 2020-04-27 NOTE — ED Notes (Signed)
Pt LWBS 

## 2020-06-01 ENCOUNTER — Emergency Department (HOSPITAL_COMMUNITY)
Admission: EM | Admit: 2020-06-01 | Discharge: 2020-06-01 | Disposition: A | Discharge status: Home / Self Care | Payer: Self-pay | Attending: Emergency Medicine | Admitting: Emergency Medicine

## 2020-06-01 ENCOUNTER — Encounter (HOSPITAL_COMMUNITY): Payer: Self-pay

## 2020-06-01 ENCOUNTER — Other Ambulatory Visit: Payer: Self-pay

## 2020-06-01 DIAGNOSIS — K0889 Other specified disorders of teeth and supporting structures: Secondary | ICD-10-CM

## 2020-06-01 DIAGNOSIS — K029 Dental caries, unspecified: Secondary | ICD-10-CM | POA: Insufficient documentation

## 2020-06-01 DIAGNOSIS — J45909 Unspecified asthma, uncomplicated: Secondary | ICD-10-CM | POA: Insufficient documentation

## 2020-06-01 MED ORDER — OXYCODONE-ACETAMINOPHEN 5-325 MG PO TABS: 1.0000 | ORAL_TABLET | ORAL | 0 refills | Status: AC | PRN

## 2020-06-01 MED ORDER — OXYCODONE-ACETAMINOPHEN 5-325 MG PO TABS
1.0000 | ORAL_TABLET | Freq: Once | ORAL | Status: AC
  Administered 2020-06-01: 1 via ORAL
  Filled 2020-06-01: qty 1

## 2020-06-01 MED ORDER — PENICILLIN V POTASSIUM 500 MG PO TABS
500.0000 mg | ORAL_TABLET | Freq: Once | ORAL | Status: AC
  Administered 2020-06-01: 500 mg via ORAL
  Filled 2020-06-01: qty 1

## 2020-06-01 MED ORDER — PENICILLIN V POTASSIUM 500 MG PO TABS: 500.0000 mg | ORAL_TABLET | Freq: Four times a day (QID) | ORAL | 0 refills | Status: AC

## 2020-06-01 NOTE — ED Triage Notes (Signed)
Right lower dental pain for 1 month.

## 2020-06-01 NOTE — Discharge Instructions (Addendum)
Follow up with dentistry for further management of known dental issues.   You can try [facility] by calling [phone] and leaving a message for a return call to set up appointment.

## 2020-06-01 NOTE — ED Provider Notes (Signed)
[facility]-EMER[facility]ENC[facility]PT Provider Note   CSN: [id number] Arrival date & time: 06/01/20  0127     History Chief Complaint  Patient presents with  . Dental Pain    Robert Ibarra is a 35 y.o. male.  Patient to ED with c/o right lower molar pain. He reports having a problem tooth that is intermittently painful for weeks but feels a piece broke off this week, increasing his pain. No fever or facial swelling. No sore throat or difficulty swallowing. He states he went to a dentist and has recently been approved for Medicaid, but does not have the money needed for the required dental treatments.   The history is provided by the patient. No language interpreter was used.  Dental Pain Associated symptoms: no facial swelling, no fever, no headaches and no neck pain        Past Medical History:  Diagnosis Date  . Asthma     There are no problems to display for this patient.   History reviewed. No pertinent surgical history.     Family History  Problem Relation Age of Onset  . Diabetes Father     Social History   Tobacco Use  . Smoking status: Current Some Day Smoker    Packs/day: 0.50    Types: Cigarettes  . Smokeless tobacco: Never Used  Substance Use Topics  . Alcohol use: Yes    Comment: occasionally  . Drug use: Yes    Types: Marijuana    Comment: occasionally    Home Medications Prior to Admission medications   Medication Sig Start Date End Date Taking? Authorizing Provider  acetaminophen (TYLENOL) 500 MG tablet Take 1 tablet (500 mg total) by mouth every 6 (six) hours as needed. 04/12/19   Fayrene Helper, PA-C  benzonatate (TESSALON) 100 MG capsule Take 1 capsule (100 mg total) by mouth every 8 (eight) hours. 04/12/19   Fayrene Helper, PA-C  ciprofloxacin (CIPRO) 500 MG tablet Take 1 tablet (500 mg total) by mouth 2 (two) times daily. 12/16/18   Cathren Laine, MD  DM-Phenylephrine-Acetaminophen (VICKS DAYQUIL COLD & FLU) 10-5-325 MG/15ML LIQD Take  2 capsules by mouth every 6 (six) hours as needed (cold symptoms).    [provider]  ibuprofen (ADVIL) 600 MG tablet Take 1 tablet (600 mg total) by mouth every 6 (six) hours as needed. 04/27/20   Elpidio Anis, PA-C  ondansetron (ZOFRAN) 4 MG tablet Take 1 tablet (4 mg total) by mouth every 6 (six) hours. 04/12/19   Fayrene Helper, PA-C  traMADol (ULTRAM) 50 MG tablet Take 1 tablet (50 mg total) by mouth every 6 (six) hours as needed. 12/16/18   Cathren Laine, MD    Allergies    Patient has no known allergies.  Review of Systems   Review of Systems  Constitutional: Negative for chills and fever.  HENT: Positive for dental problem. Negative for facial swelling.   Respiratory: Negative.   Cardiovascular: Negative.   Gastrointestinal: Negative.  Negative for nausea.  Musculoskeletal: Negative.  Negative for neck pain.  Skin: Negative.  Negative for color change.  Neurological: Negative.  Negative for headaches.    Physical Exam Updated Vital Signs BP 123/83   Pulse 71   Temp 98.2 F (36.8 C)   Resp 16   SpO2 100%   Physical Exam Vitals and nursing note reviewed.  Constitutional:      Appearance: He is well-developed and well-nourished.  HENT:     Mouth/Throat:     Comments: Stable  dentition. There is a large carie to #31. No visualized abscess.  Pulmonary:     Effort: Pulmonary effort is normal.  Musculoskeletal:        General: Normal range of motion.     Cervical back: Normal range of motion.  Skin:    General: Skin is warm and dry.  Neurological:     Mental Status: He is alert and oriented to person, place, and time.  Psychiatric:        Mood and Affect: Mood and affect normal.     ED Results / Procedures / Treatments   Labs (all labs ordered are listed, but only abnormal results are displayed) Labs Reviewed - No data to display  EKG None  Radiology No results found.  Procedures Procedures (including critical care time)  Medications Ordered in  ED Medications - No data to display  ED Course  I have reviewed the triage vital signs and the nursing notes.  Pertinent labs & imaging results that were available during my care of the patient were reviewed by me and considered in my medical decision making (see chart for details).    MDM Rules/Calculators/A&P                          Patient to ED with continued/worsening dental pain on right lower rear molar, now feels there is new fracture.   There is significant decay to right rear lower molar. No visualized abscess. No fever/facial swelling. Will cover with abx, provide pain relief. Refer again to outpatient dental care.  Final Clinical Impression(s) / ED Diagnoses Final diagnoses:  None   1. Dental caries  Rx / DC Orders ED Discharge Orders    None       Elpidio Anis, PA-C 06/01/20 0303    Geoffery Lyons, MD 06/01/20 [phone]

## 2021-03-24 ENCOUNTER — Encounter (HOSPITAL_COMMUNITY): Payer: Self-pay

## 2021-03-24 ENCOUNTER — Emergency Department (HOSPITAL_COMMUNITY)
Admission: EM | Admit: 2021-03-24 | Discharge: 2021-03-25 | Disposition: A | Discharge status: Home / Self Care | Payer: Medicaid Other | Attending: Emergency Medicine | Admitting: Emergency Medicine

## 2021-03-24 ENCOUNTER — Other Ambulatory Visit: Payer: Self-pay

## 2021-03-24 ENCOUNTER — Emergency Department (HOSPITAL_COMMUNITY): Payer: Medicaid Other

## 2021-03-24 DIAGNOSIS — R519 Headache, unspecified: Secondary | ICD-10-CM | POA: Insufficient documentation

## 2021-03-24 DIAGNOSIS — Z5321 Procedure and treatment not carried out due to patient leaving prior to being seen by health care provider: Secondary | ICD-10-CM | POA: Insufficient documentation

## 2021-03-24 DIAGNOSIS — M549 Dorsalgia, unspecified: Secondary | ICD-10-CM | POA: Insufficient documentation

## 2021-03-24 DIAGNOSIS — R0781 Pleurodynia: Secondary | ICD-10-CM | POA: Insufficient documentation

## 2021-03-24 NOTE — ED Triage Notes (Signed)
Pt c/o abdominal pain and back pain x1 week. Pt states he has had some nausea off and on. Pt c/o migraines, this is a chronic problem for pt.

## 2021-03-24 NOTE — ED Provider Notes (Signed)
Emergency Medicine Provider Triage Evaluation Note  Robert Ibarra , a 35 y.o. male  was evaluated in triage.  Pt complains of right-sided rib/back pain for the last week.  Also complaining of chronic headaches that been worsening.  Currently mild in severity.  No focal weakness or numbness.  Review of Systems  Positive:  Negative: See above   Physical Exam  BP 111/72 (BP Location: Left Arm)   Pulse 94   Temp 98.2 F (36.8 C) (Oral)   Resp 18   Ht 5\' 4"  (1.626 m)   Wt 63.5 kg   SpO2 100%   BMI 24.03 kg/m  Gen:   Awake, no distress   Resp:  Normal effort  MSK:   Moves extremities without difficulty  Other:  No midline tenderness of the cervical, thoracic, or lumbar spine.  Medical Decision Making  Medically screening exam initiated at 5:16 PM.  Appropriate orders placed.  Campbell Kray was informed that the remainder of the evaluation will be completed by another provider, this initial triage assessment does not replace that evaluation, and the importance of remaining in the ED until their evaluation is complete.     Darden Palmer Monroe, PA-C 03/24/21 1717    13/02/22, MD 03/26/21 [phone]

## 2021-03-25 ENCOUNTER — Emergency Department (HOSPITAL_COMMUNITY)
Admission: EM | Admit: 2021-03-25 | Discharge: 2021-03-25 | Disposition: A | Discharge status: Home / Self Care | Payer: Medicaid Other | Attending: Emergency Medicine | Admitting: Emergency Medicine

## 2021-03-25 ENCOUNTER — Encounter (HOSPITAL_COMMUNITY): Payer: Self-pay

## 2021-03-25 DIAGNOSIS — F17210 Nicotine dependence, cigarettes, uncomplicated: Secondary | ICD-10-CM | POA: Insufficient documentation

## 2021-03-25 DIAGNOSIS — R519 Headache, unspecified: Secondary | ICD-10-CM

## 2021-03-25 DIAGNOSIS — J019 Acute sinusitis, unspecified: Secondary | ICD-10-CM | POA: Insufficient documentation

## 2021-03-25 DIAGNOSIS — G8929 Other chronic pain: Secondary | ICD-10-CM | POA: Insufficient documentation

## 2021-03-25 DIAGNOSIS — M545 Low back pain, unspecified: Secondary | ICD-10-CM | POA: Insufficient documentation

## 2021-03-25 DIAGNOSIS — J45909 Unspecified asthma, uncomplicated: Secondary | ICD-10-CM | POA: Insufficient documentation

## 2021-03-25 MED ORDER — METHOCARBAMOL 500 MG PO TABS
750.0000 mg | ORAL_TABLET | Freq: Once | ORAL | Status: AC
  Administered 2021-03-25: 750 mg via ORAL
  Filled 2021-03-25: qty 2

## 2021-03-25 MED ORDER — LIDOCAINE 5 % EX PTCH: 1.0000 | MEDICATED_PATCH | CUTANEOUS | 0 refills | Status: DC

## 2021-03-25 MED ORDER — FLUTICASONE PROPIONATE 50 MCG/ACT NA SUSP: 1.0000 | Freq: Every day | NASAL | 0 refills | Status: DC

## 2021-03-25 MED ORDER — METHOCARBAMOL 500 MG PO TABS: 500.0000 mg | ORAL_TABLET | ORAL | 0 refills | Status: AC | PRN

## 2021-03-25 MED ORDER — DEXAMETHASONE SODIUM PHOSPHATE 10 MG/ML IJ SOLN
10.0000 mg | Freq: Once | INTRAMUSCULAR | Status: AC
  Administered 2021-03-25: 10 mg via INTRAMUSCULAR
  Filled 2021-03-25: qty 1

## 2021-03-25 MED ORDER — NAPROXEN 500 MG PO TABS: 500.0000 mg | ORAL_TABLET | Freq: Two times a day (BID) | ORAL | 0 refills | Status: AC

## 2021-03-25 MED ORDER — KETOROLAC TROMETHAMINE 15 MG/ML IJ SOLN
15.0000 mg | Freq: Once | INTRAMUSCULAR | Status: AC
  Administered 2021-03-25: 15 mg via INTRAMUSCULAR
  Filled 2021-03-25: qty 1

## 2021-03-25 MED ORDER — LIDOCAINE 5 % EX PTCH
1.0000 | MEDICATED_PATCH | CUTANEOUS | Status: DC
  Administered 2021-03-25: 1 via TRANSDERMAL
  Filled 2021-03-25: qty 1

## 2021-03-25 NOTE — ED Notes (Signed)
An After Visit Summary was printed and given to the patient. Discharge instructions given and no further questions at this time.  

## 2021-03-25 NOTE — ED Triage Notes (Signed)
Pt presents with c/o headache and back pain. Pt reports that he has a hx of migraines, this one started one week ago. Pt also c/o back pain.

## 2021-03-25 NOTE — Discharge Instructions (Signed)
Take naproxen 2 times a day with meals.  Do not take other anti-inflammatories at the same time (Advil, Motrin, ibuprofen, Aleve). You may supplement with Tylenol if you need further pain control. Use the nasal spray to help with sinus inflammation and pain.  Use the lidoderm patches to help with pain. Use the muscle relaxer as needed for sharp pain or spasms. Have caution, this may make you tired or groggy. Do not drive or operate heavy machinery while taking this medicine.  Use ice packs or heating pads if this helps control your pain. Do the back exercises/stretches in the paperwork to help with pain.  Call the neurology office listed below to set up a follow up appointment.  Return to the ER if you develop high fevers, persistent numbness, or any new, worsening, or concerning symptoms.

## 2021-03-25 NOTE — ED Provider Notes (Signed)
[facility]-EMERGENCY DEPT Provider Note   CSN: [id number] Arrival date & time: 03/25/21  1024     History Chief Complaint  Patient presents with   Headache   Back Pain    Robert Ibarra is a 35 y.o. male presenting for evaluation of headache and back pain.  Patient states he has had headaches since a teenager.  Headaches are almost daily.  Over the past week, his headache has been worse.  Headache is always on the right side, but now it is mostly in the front over his right eyebrow and over his right cheek.  He reports associated nasal congestion, postnasal drip.  He is not taking anything for his headaches, has never had them evaluated.  No numbness or tingling.  No trauma or injury.  No fevers. Additionally, patient reports chronic back pain which has worsened over the past few weeks.  Pain is on both sides of his back, mostly in the lower aspect.  He does not do any heavy lifting for work.  He takes [facility] powders occasionally without improvement.  No numbness or tingling.  No loss of bowel bladder control.  No history of cancer or IVDU. Patient states last night his partner reported that his left arm was twitching.  He had some weakness in his left arm immediately after waking, but reports he has had no weakness or numbness since.  No twitching or abnormal movement since.  HPI     Past Medical History:  Diagnosis Date   Asthma     There are no problems to display for this patient.   History reviewed. No pertinent surgical history.     Family History  Problem Relation Age of Onset   Diabetes Father     Social History   Tobacco Use   Smoking status: Some Days    Packs/day: 0.50    Types: Cigarettes   Smokeless tobacco: Never  Substance Use Topics   Alcohol use: Yes    Comment: occasionally   Drug use: Yes    Types: Marijuana    Comment: occasionally    Home Medications Prior to Admission medications   Medication Sig Start Date End Date  Taking? Authorizing Provider  fluticasone (FLONASE) 50 MCG/ACT nasal spray Place 1 spray into both nostrils daily. 03/25/21  Yes Lavida Patch, PA-C  lidocaine (LIDODERM) 5 % Place 1 patch onto the skin daily. Remove & Discard patch within 12 hours or as directed by MD 03/25/21  Yes Wylan Gentzler, PA-C  methocarbamol (ROBAXIN) 500 MG tablet Take 1 tablet (500 mg total) by mouth as needed for muscle spasms. 03/25/21  Yes Birttany Dechellis, PA-C  naproxen (NAPROSYN) 500 MG tablet Take 1 tablet (500 mg total) by mouth 2 (two) times daily with a meal. 03/25/21  Yes Brenn Deziel, PA-C  acetaminophen (TYLENOL) 500 MG tablet Take 1 tablet (500 mg total) by mouth every 6 (six) hours as needed. 04/12/19   Fayrene Helper, PA-C  benzonatate (TESSALON) 100 MG capsule Take 1 capsule (100 mg total) by mouth every 8 (eight) hours. 04/12/19   Fayrene Helper, PA-C  ciprofloxacin (CIPRO) 500 MG tablet Take 1 tablet (500 mg total) by mouth 2 (two) times daily. 12/16/18   Cathren Laine, MD  DM-Phenylephrine-Acetaminophen (VICKS DAYQUIL COLD & FLU) 10-5-325 MG/15ML LIQD Take 2 capsules by mouth every 6 (six) hours as needed (cold symptoms).    [provider]  ibuprofen (ADVIL) 600 MG tablet Take 1 tablet (600 mg total) by mouth every 6 (  six) hours as needed. 04/27/20   Elpidio Anis, PA-C  ondansetron (ZOFRAN) 4 MG tablet Take 1 tablet (4 mg total) by mouth every 6 (six) hours. 04/12/19   Fayrene Helper, PA-C  oxyCODONE-acetaminophen (PERCOCET/ROXICET) 5-325 MG tablet Take 1 tablet by mouth every 4 (four) hours as needed for severe pain. 06/01/20   Elpidio Anis, PA-C  traMADol (ULTRAM) 50 MG tablet Take 1 tablet (50 mg total) by mouth every 6 (six) hours as needed. 12/16/18   Cathren Laine, MD    Allergies    Patient has no known allergies.  Review of Systems   Review of Systems  HENT:  Positive for congestion.   Musculoskeletal:  Positive for back pain.  Neurological:  Positive for headaches.  All other  systems reviewed and are negative.  Physical Exam Updated Vital Signs BP 121/72   Pulse 77   Temp 98 F (36.7 C) (Oral)   Resp 18   SpO2 99%   Physical Exam Vitals and nursing note reviewed.  Constitutional:      General: He is not in acute distress.    Appearance: Normal appearance.     Comments: Resting in the bed in NAD  HENT:     Head: Normocephalic and atraumatic.     Right Ear: A middle ear effusion is present.     Left Ear: Tympanic membrane, ear canal and external ear normal.     Ears:     Comments: Fluid behind the right TM.  No TM erythema or bulging.    Nose: Congestion present.     Mouth/Throat:     Mouth: Mucous membranes are moist.     Pharynx: Oropharynx is clear.  Eyes:     Extraocular Movements: Extraocular movements intact.     Conjunctiva/sclera: Conjunctivae normal.     Pupils: Pupils are equal, round, and reactive to light.  Cardiovascular:     Rate and Rhythm: Normal rate and regular rhythm.     Pulses: Normal pulses.  Pulmonary:     Effort: Pulmonary effort is normal. No respiratory distress.     Breath sounds: Normal breath sounds. No wheezing.     Comments: Speaking in full sentences.  Clear lung sounds in all fields. Abdominal:     General: There is no distension.     Palpations: Abdomen is soft. There is no mass.     Tenderness: There is no abdominal tenderness. There is no guarding or rebound.  Musculoskeletal:        General: Normal range of motion.     Cervical back: Normal range of motion and neck supple.     Comments: Tenderness palpation of bilateral low back musculature.  No pain over midline spine.  No step-offs or deformities.  Strength and sensation intact x4.  Skin:    General: Skin is warm and dry.     Capillary Refill: Capillary refill takes less than 2 seconds.  Neurological:     Mental Status: He is alert and oriented to person, place, and time.     GCS: GCS eye subscore is 4. GCS verbal subscore is 5. GCS motor subscore is  6.     Cranial Nerves: Cranial nerves 2-12 are intact.     Sensory: Sensation is intact.     Motor: Motor function is intact.  Psychiatric:        Mood and Affect: Mood and affect normal.        Speech: Speech normal.  Behavior: Behavior normal.    ED Results / Procedures / Treatments   Labs (all labs ordered are listed, but only abnormal results are displayed) Labs Reviewed - No data to display  EKG None  Radiology DG Chest 2 View  Result Date: 03/24/2021 CLINICAL DATA:  Right-sided rib pain and back pain EXAM: CHEST - 2 VIEW COMPARISON:  06/02/2016 FINDINGS: The heart size and mediastinal contours are within normal limits. Both lungs are clear. The visualized skeletal structures are unremarkable. IMPRESSION: No active cardiopulmonary disease. Electronically Signed   By: Marlan Palau M.D.   On: 03/24/2021 18:13   CT Head Wo Contrast  Result Date: 03/24/2021 CLINICAL DATA:  headache EXAM: CT HEAD WITHOUT CONTRAST TECHNIQUE: Contiguous axial images were obtained from the base of the skull through the vertex without intravenous contrast. COMPARISON:  CT head September 02, 2016. FINDINGS: Brain: No evidence of acute infarction, hemorrhage, hydrocephalus, extra-axial collection or mass lesion/mass effect. Vascular: No hyperdense vessel identified. Skull: Similar enlarged parietal foramina.  No acute fracture. Sinuses/Orbits: Frothy secretions in the partially imaged right maxillary sinus. Remote right medial and inferior orbital wall fractures, partially imaged. Other: No mastoid effusions. IMPRESSION: 1. No evidence of acute intracranial abnormality. 2. Partially imaged frothy secretions in the right maxillary sinus. Correlate with signs/symptoms of sinusitis. Electronically Signed   By: Feliberto Harts M.D.   On: 03/24/2021 17:55    Procedures Procedures   Medications Ordered in ED Medications  lidocaine (LIDODERM) 5 % 1 patch (1 patch Transdermal Patch Applied 03/25/21 1605)   dexamethasone (DECADRON) injection 10 mg (10 mg Intramuscular Given 03/25/21 1605)  ketorolac (TORADOL) 15 MG/ML injection 15 mg (15 mg Intramuscular Given 03/25/21 1606)  methocarbamol (ROBAXIN) tablet 750 mg (750 mg Oral Given 03/25/21 1606)    ED Course  I have reviewed the triage vital signs and the nursing notes.  Pertinent labs & imaging results that were available during my care of the patient were reviewed by me and considered in my medical decision making (see chart for details).    MDM Rules/Calculators/A&P                           Patient presenting for evaluation of acute on chronic headache and back pain.  On exam, patient appears nontoxic.  He is neurovascular intact.  Reports daily headaches which is most consistent with migraines.  He also is having some sinus issues now, has tenderness palpation over his frontal maxillary sinus on the right side.  CT head obtained yesterday when patient came to the ER but was not evaluated shows sinusitis on the right side.  This is likely exacerbating his chronic migraines.  Discussed with patient.  Discussed that there is no other acute intracranial abnormality seen yesterday.  Doubt acute neurologic emergency requiring MRI or further emergent evaluation.  Will encourage follow-up with neurology.  Discussed symptomatic management. Regarding patient's back pain, this is reproducible with palpation over the musculature.  Will have patient treat symptomatically.  At this time, patient appears safe for discharge.  Return precautions given.  Patient states he understands and agrees to plan.   Final Clinical Impression(s) / ED Diagnoses Final diagnoses:  Acute sinusitis, recurrence not specified, unspecified location  Chronic nonintractable headache, unspecified headache type  Chronic bilateral low back pain without sciatica    Rx / DC Orders ED Discharge Orders          Ordered    naproxen (NAPROSYN) 500 MG  tablet  2 times daily with meals         03/25/21 1550    lidocaine (LIDODERM) 5 %  Every 24 hours        03/25/21 1550    fluticasone (FLONASE) 50 MCG/ACT nasal spray  Daily        03/25/21 1550    methocarbamol (ROBAXIN) 500 MG tablet  As needed        03/25/21 1550             Kam Rahimi, PA-C 03/25/21 1649    Pollyann Savoy, MD 03/25/21 2308

## 2021-04-05 ENCOUNTER — Other Ambulatory Visit: Payer: Self-pay

## 2021-04-05 ENCOUNTER — Ambulatory Visit: Payer: Self-pay | Admitting: Psychiatry

## 2021-04-05 ENCOUNTER — Encounter: Payer: Self-pay | Admitting: Psychiatry

## 2021-04-05 VITALS — BP 112/71 | HR 82 | Ht 64.0 in | Wt 139.0 lb

## 2021-04-05 DIAGNOSIS — G43001 Migraine without aura, not intractable, with status migrainosus: Secondary | ICD-10-CM

## 2021-04-05 DIAGNOSIS — J320 Chronic maxillary sinusitis: Secondary | ICD-10-CM

## 2021-04-05 DIAGNOSIS — M5410 Radiculopathy, site unspecified: Secondary | ICD-10-CM

## 2021-04-05 MED ORDER — AMITRIPTYLINE HCL 25 MG PO TABS: 25.0000 mg | ORAL_TABLET | Freq: Every day | ORAL | 3 refills | Status: AC

## 2021-04-05 MED ORDER — METHYLPREDNISOLONE 4 MG PO TBPK: ORAL_TABLET | ORAL | 0 refills | Status: AC
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
## 2021-04-05 NOTE — Patient Instructions (Addendum)
Start elavil 25 daily at bedtime to help prevent headaches MRI C-spine Referral to ENT Steroid pack to help reduce inflammation

## 2021-04-07 ENCOUNTER — Telehealth: Payer: Self-pay | Admitting: Psychiatry

## 2021-04-07 NOTE — Telephone Encounter (Signed)
self pay order sent to GI, they will reach out to the patient to schedule.  

## 2021-04-08 ENCOUNTER — Other Ambulatory Visit: Payer: Self-pay | Admitting: Psychiatry

## 2021-04-08 DIAGNOSIS — Z77018 Contact with and (suspected) exposure to other hazardous metals: Secondary | ICD-10-CM

## 2021-04-26 ENCOUNTER — Ambulatory Visit
Admission: RE | Admit: 2021-04-26 | Discharge: 2021-04-26 | Disposition: A | Discharge status: Home / Self Care | Payer: Self-pay | Source: Ambulatory Visit | Attending: Psychiatry | Admitting: Psychiatry

## 2021-04-26 ENCOUNTER — Other Ambulatory Visit: Payer: Self-pay

## 2021-04-26 DIAGNOSIS — M5410 Radiculopathy, site unspecified: Secondary | ICD-10-CM

## 2021-04-26 DIAGNOSIS — Z77018 Contact with and (suspected) exposure to other hazardous metals: Secondary | ICD-10-CM

## 2021-04-27 ENCOUNTER — Other Ambulatory Visit: Payer: Self-pay | Admitting: Psychiatry

## 2021-04-27 ENCOUNTER — Telehealth: Payer: Self-pay

## 2021-04-27 DIAGNOSIS — M542 Cervicalgia: Secondary | ICD-10-CM

## 2021-04-27 NOTE — Telephone Encounter (Signed)
-----   Message from Ocie Doyne, MD sent at 04/27/2021  9:07 AM EST ----- MRI C-spine shows a disc bulge at C6-7 which is pressing on the spinal cord. This may be contributing to his neck pain. He would likely benefit from neck PT. I've placed a physical therapy referral for him

## 2021-04-27 NOTE — Telephone Encounter (Signed)
Contacted pt, informed him his MRI C-spine shows a disc bulge at C6-7 which is pressing on the spinal cord. This may be contributing to his neck pain. Per Dr Delena Bali, he would likely benefit from neck PT. Informed she placed a physical therapy referral for him and they will be in contact. He was appreciative

## 2021-05-19 ENCOUNTER — Ambulatory Visit: Payer: Medicaid Other | Attending: Psychiatry

## 2021-05-19 ENCOUNTER — Other Ambulatory Visit: Payer: Self-pay

## 2021-05-19 DIAGNOSIS — M6281 Muscle weakness (generalized): Secondary | ICD-10-CM | POA: Insufficient documentation

## 2021-05-19 DIAGNOSIS — M542 Cervicalgia: Secondary | ICD-10-CM | POA: Insufficient documentation

## 2021-05-19 DIAGNOSIS — R293 Abnormal posture: Secondary | ICD-10-CM | POA: Insufficient documentation

## 2021-05-19 DIAGNOSIS — R29898 Other symptoms and signs involving the musculoskeletal system: Secondary | ICD-10-CM | POA: Insufficient documentation
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
## 2021-05-25 ENCOUNTER — Ambulatory Visit: Payer: Self-pay | Attending: Psychiatry

## 2021-05-25 ENCOUNTER — Telehealth: Payer: Self-pay

## 2021-05-25 DIAGNOSIS — R293 Abnormal posture: Secondary | ICD-10-CM | POA: Insufficient documentation

## 2021-05-25 DIAGNOSIS — R29898 Other symptoms and signs involving the musculoskeletal system: Secondary | ICD-10-CM | POA: Insufficient documentation

## 2021-05-25 DIAGNOSIS — M6281 Muscle weakness (generalized): Secondary | ICD-10-CM | POA: Insufficient documentation

## 2021-05-25 DIAGNOSIS — M542 Cervicalgia: Secondary | ICD-10-CM | POA: Insufficient documentation

## 2021-05-25 NOTE — Telephone Encounter (Signed)
Spoke with pt about today's no show appt. Pt states he forgot. Pt was reminded of his upcoming appt. Reviewed attendance policy. Robert Ibarra voiced understanding.

## 2021-05-25 NOTE — Therapy (Incomplete)
OUTPATIENT PHYSICAL THERAPY TREATMENT NOTE   Patient Name: Robert Ibarra MRN: [id number] DOB:December 31, 1985, 36 y.o., male Today's Date: 05/25/2021  PCP: Patient, No Pcp Per (Inactive) REFERRING PROVIDER: Genia Harold, MD    Past Medical History:  Diagnosis Date   Asthma    No past surgical history on file. There are no problems to display for this patient.   REFERRING DIAG: Cervicalgia   THERAPY DIAG:   PERTINENT HISTORY: Chronic migraine, smokes- reduced to 2 cigerettes/day from a pack  PRECAUTIONS: None  SUBJECTIVE: ***  PAIN:  Are you having pain? {yes/no:20286} VAS scale: ***/10 Pain location: *** Pain orientation: {Pain Orientation:25161}  PAIN TYPE: {type:313116} Pain description: {PAIN DESCRIPTION:21022940}  Aggravating factors: *** Relieving factors: ***   OBJECTIVE:    DIAGNOSTIC FINDINGS:    The discs and interspaces were further evaluated on axial views from C2 to T1 as follows: C2 - C3:  The disc and interspace appear normal. C3 - C4:  The disc and interspace appear normal. C4 - C5:  The disc and interspace appear normal. C5 - C6:  The disc and interspace appear normal. C6 - C7: There is mild disc bulging and mild ligamenta flava hypertrophy causing mild spinal stenosis and mild foraminal narrowing but no nerve root compression.. C7 - T1:  The disc and interspace appear normal.     IMPRESSION: This MRI of the cervical spine without contrast shows the following: 1.   The spinal cord appears normal. 2.   At C6-C7, there is mild spinal stenosis due to mild disc bulging and ligamenta flava hypertrophy but no nerve root compression.   PATIENT SURVEYS:  NA- MCD     COGNITION: Overall cognitive status: Within functional limits for tasks assessed            SENSATION: Light touch: Appears intact, but pt reports decreased quality of sensation with the L arm and hand      POSTURE:  Forward head, rounded shoulders, forward flexed trunk    PALPATION:           Tender to light palpation           CERVICAL AROM/PROM   A/PROM A/PROM (deg) 05/21/2021  Flexion 20  Extension 16  Right lateral flexion 12  Left lateral flexion 11  Right rotation 14  Left rotation 28   (Blank rows = not tested) - Cervical AROM was limited due to pain. Pt reported provoked L lateral cervical pain c flex, R LF, and R rot and L lateral/post neck and mid back c ext, L LF, and L rot   UE AROM/PROM:                     - Bilat shoulder ROMs were equal and grossly WFLs   UE MMT:   MMT Right 05/21/2021 Left 05/21/2021  Shoulder flexion 3+ 3  Shoulder extension 3+ 3  Shoulder abduction 3+ 3  Shoulder adduction 3+ 3  Shoulder extension 3+ 3  Middle trapezius      Lower trapezius      Elbow flexion 3+ 3  Elbow extension 3+ 3  Wrist flexion 3+ 3  Wrist extension 3+ 3  Wrist ulnar deviation 3+ 3  Wrist radial deviation 3+ 3  Wrist pronation 3+ 3  Wrist supination 3+ 3  Grip strength       (Blank rows = not tested) Gross strength measures above with pt reporting increased pain with each MMT. The L UE strength was limited in  comparison to the R.     CERVICAL SPECIAL TESTS:  Spurling's test: Unable to assess properly with pt's limited cervical AROM with all motions due to pain.     FUNCTIONAL TESTS:  NA   OBSERVATION: Pt completed walking, transfers, neck motions and active movements of his UE at a decreased pace  [facility] Adult PT  Treatment:                                                DATE: 05/25/20  Therapeutic Exercise: *** Manual Therapy: *** Neuromuscular re-ed: *** Therapeutic Activity: *** Modalities: *** Self Care: ***    TODAY'S TREATMENT:  Cervical retraction x10, 3" Scapular retraction x10, 3"     PATIENT EDUCATION:  Education details: Eval findings, POC, HEP Person educated: Patient Education method: Explanation, Demonstration, Tactile cues, Verbal cues, and Handouts Education comprehension: verbalized  understanding, returned demonstration, verbal cues required, and tactile cues required     HOME EXERCISE PROGRAM: Access Code: [email or web address] URL: [email or web address] Date: 05/20/2021 Prepared by: Gar Ponto   Exercises Seated Passive Cervical Retraction - 4 x daily - 7 x weekly - 1 sets - 10 reps - 3 hold Seated Scapular Retraction - 4 x daily - 7 x weekly - 1 sets - 10 reps - 3 hold     ASSESSMENT:   CLINICAL IMPRESSION: Patient is a 36 y.o. M who was seen today for physical therapy evaluation and treatment for cervicalgia. Objective impairments include decreased activity tolerance, decreased ROM, decreased strength, postural dysfunction, and pain. Deferential diagnosis was not able to be assessed due to the high level of pain irritability of the pt's neck and upper shoulder areas, L>R. Cervical ROMs are limited significantly for all motions. Strength of both upper extremities was grossly weak L>R with pt reporting neck pain with muscle testing, and pt demonstrates postural deficits in sitting and standing.  These impairments are limiting patient from cleaning, community activity, driving, meal prep, occupation, yard work, and shopping. Personal factors including Fitness, Past/current experiences, Time since onset of injury/illness/exacerbation, Transportation, and 1 comorbidity: Hx of smoking  are also affecting patient's functional outcome. Patient will benefit from skilled PT to address above impairments and improve overall function.   REHAB POTENTIAL: Fair due the chronicity of pt's condition   CLINICAL DECISION MAKING: Stable/uncomplicated   EVALUATION COMPLEXITY: Low     GOALS:     SHORT TERM GOALS:   STG Name Target Date Goal status  1 Pt will be Ind in a HEP to address deficits and reduce pain Baseline:  06/11/21 INITIAL  2 Pt will voice understanding of measures to assist in the management and reduction of pain Baseline:  06/11/21 INITIAL      LONG TERM  GOALS:    LTG Name Target Date Goal status  1 Increase cervical ROM by 15d for all motions for improve neck function Baseline:see flowsheets 07/02/21 INITIAL  2 Increase UE strength to 4+/5 for improved functional use with daily activities Baseline:L 3/5, R 3+/5 07/02/21 INITIAL  3 Pt will report a decrease in neck and upper shoulder pain to 6/10 or less with daily activities Baseline:5-10/10 07/02/21 INITIAL  4 Pt will be able to obtain and maintain proper sitting posture for 5 mins Baseline: Forward head, rounded shoulders, FF trunk 07/02/21 INITIAL  5 Pt will be Ind in a final HEP to maintain  achieved LOF Baseline: 07/02/21 INITIAL      PLAN: PT FREQUENCY: 2x/week   PT DURATION: 6 weeks   PLANNED INTERVENTIONS: Therapeutic exercises, Therapeutic activity, Neuro Muscular re-education, Balance training, Gait training, Patient/Family education, Joint mobilization, Dry Needling, Electrical stimulation, Spinal mobilization, Cryotherapy, Moist heat, Taping, Vasopneumatic device, Traction, Ultrasound, Biofeedback, and Manual therapy   PLAN FOR NEXT SESSION: Assess pt's response to HEP. Progress therex as indicated. Use of manual therapy and modalities as indicated.     Sherlene Rickel MS, PT 05/21/21 3:31 PM

## 2021-05-26 NOTE — Therapy (Incomplete)
OUTPATIENT PHYSICAL THERAPY TREATMENT NOTE   Patient Name: Robert Ibarra MRN: [id number] DOB:03-20-1986, 36 y.o., male Today's Date: 05/26/2021  PCP: Patient, No Pcp Per (Inactive) REFERRING PROVIDER: Genia Harold, MD    Past Medical History:  Diagnosis Date   Asthma    No past surgical history on file. There are no problems to display for this patient.   REFERRING DIAG: Cervicalgia  THERAPY DIAG:  No diagnosis found.  PERTINENT HISTORY: Chronic migraine, smokes- reduced to 2 cigerettes/day from a pack   SUBJECTIVE: ***  PAIN:  Are you having pain? {yes/no:20286} VAS scale: ***/10 Pain location: *** Pain orientation: {Pain Orientation:25161}  PAIN TYPE: {type:313116} Pain description: {PAIN DESCRIPTION:21022940}  Aggravating factors: *** Relieving factors: ***     OBJECTIVE:   *Unless otherwise noted, objective information captured at evaluation* DIAGNOSTIC FINDINGS:    The discs and interspaces were further evaluated on axial views from C2 to T1 as follows: C2 - C3:  The disc and interspace appear normal. C3 - C4:  The disc and interspace appear normal. C4 - C5:  The disc and interspace appear normal. C5 - C6:  The disc and interspace appear normal. C6 - C7: There is mild disc bulging and mild ligamenta flava hypertrophy causing mild spinal stenosis and mild foraminal narrowing but no nerve root compression.. C7 - T1:  The disc and interspace appear normal.     IMPRESSION: This MRI of the cervical spine without contrast shows the following: 1.   The spinal cord appears normal. 2.   At C6-C7, there is mild spinal stenosis due to mild disc bulging and ligamenta flava hypertrophy but no nerve root compression.   PATIENT SURVEYS:  NA- MCD     COGNITION: Overall cognitive status: Within functional limits for tasks assessed            SENSATION: Light touch: Appears intact, but pt reports decreased quality of sensation with the L arm and hand       POSTURE:  Forward head, rounded shoulders, forward flexed trunk   PALPATION:           Tender to light palpation           CERVICAL AROM/PROM   A/PROM A/PROM (deg) 05/21/2021  Flexion 20  Extension 16  Right lateral flexion 12  Left lateral flexion 11  Right rotation 14  Left rotation 28   (Blank rows = not tested) - Cervical AROM was limited due to pain. Pt reported provoked L lateral cervical pain c flex, R LF, and R rot and L lateral/post neck and mid back c ext, L LF, and L rot   UE AROM/PROM:                     - Bilat shoulder ROMs were equal and grossly WFLs   UE MMT:   MMT Right 05/21/2021 Left 05/21/2021  Shoulder flexion 3+ 3  Shoulder extension 3+ 3  Shoulder abduction 3+ 3  Shoulder adduction 3+ 3  Shoulder extension 3+ 3  Middle trapezius      Lower trapezius      Elbow flexion 3+ 3  Elbow extension 3+ 3  Wrist flexion 3+ 3  Wrist extension 3+ 3  Wrist ulnar deviation 3+ 3  Wrist radial deviation 3+ 3  Wrist pronation 3+ 3  Wrist supination 3+ 3  Grip strength       (Blank rows = not tested) Gross strength measures above with pt reporting increased pain with  each MMT. The L UE strength was limited in comparison to the R.     CERVICAL SPECIAL TESTS:  Spurling's test: Unable to assess properly with pt's limited cervical AROM with all motions due to pain.     FUNCTIONAL TESTS:  NA   OBSERVATION: Pt completed walking, transfers, neck motions and active movements of his UE at a decreased pace   TODAY'S TREATMENT:  [facility] Adult PT Treatment:                                                DATE: 05/27/2021 Therapeutic Exercise: *** Manual Therapy: *** Neuromuscular re-ed: *** Therapeutic Activity: *** Modalities: *** Self Care: ***  Performed at eval: Cervical retraction x10, 3" Scapular retraction x10, 3"     PATIENT EDUCATION:  Education details: Eval findings, POC, HEP Person educated: Patient Education method: Explanation,  Demonstration, Tactile cues, Verbal cues, and Handouts Education comprehension: verbalized understanding, returned demonstration, verbal cues required, and tactile cues required     HOME EXERCISE PROGRAM: Access Code: [email or web address] URL: [email or web address] Date: 05/20/2021 Prepared by: Gar Ponto   Exercises Seated Passive Cervical Retraction - 4 x daily - 7 x weekly - 1 sets - 10 reps - 3 hold Seated Scapular Retraction - 4 x daily - 7 x weekly - 1 sets - 10 reps - 3 hold     ASSESSMENT:   CLINICAL IMPRESSION: ***   REHAB POTENTIAL: Fair due the chronicity of pt's condition   CLINICAL DECISION MAKING: Stable/uncomplicated   EVALUATION COMPLEXITY: Low     GOALS:     SHORT TERM GOALS:   STG Name Target Date Goal status  1 Pt will be Ind in a HEP to address deficits and reduce pain Baseline:  06/11/21 INITIAL  2 Pt will voice understanding of measures to assist in the management and reduction of pain Baseline:  06/11/21 INITIAL      LONG TERM GOALS:    LTG Name Target Date Goal status  1 Increase cervical ROM by 15d for all motions for improve neck function Baseline:see flowsheets 07/02/21 INITIAL  2 Increase UE strength to 4+/5 for improved functional use with daily activities Baseline:L 3/5, R 3+/5 07/02/21 INITIAL  3 Pt will report a decrease in neck and upper shoulder pain to 6/10 or less with daily activities Baseline:5-10/10 07/02/21 INITIAL  4 Pt will be able to obtain and maintain proper sitting posture for 5 mins Baseline: Forward head, rounded shoulders, FF trunk 07/02/21 INITIAL  5 Pt will be Ind in a final HEP to maintain achieved LOF Baseline: 07/02/21 INITIAL      PLAN: PT FREQUENCY: 2x/week   PT DURATION: 6 weeks   PLANNED INTERVENTIONS: Therapeutic exercises, Therapeutic activity, Neuro Muscular re-education, Balance training, Gait training, Patient/Family education, Joint mobilization, Dry Needling, Electrical stimulation, Spinal  mobilization, Cryotherapy, Moist heat, Taping, Vasopneumatic device, Traction, Ultrasound, Biofeedback, and Manual therapy   PLAN FOR NEXT SESSION: Assess pt's response to HEP. Progress therex as indicated. Use of manual therapy and modalities as indicated.    Carolann Littler Chenika Nevils 05/26/2021, 9:19 AM

## 2021-05-27 ENCOUNTER — Telehealth: Payer: Self-pay

## 2021-05-27 ENCOUNTER — Ambulatory Visit: Payer: Self-pay

## 2021-05-27 NOTE — Telephone Encounter (Signed)
Left message for pt regarding his 2nd consecutive no-show. Detailed the clinic attendance policy and informed patient that all future appoints except his next appointment for next Tuesday have been cancelled. Also informed that three no-shows would be grounds for discharge. Left the clinic phone number in case he needs to cancel or reschedule future appointments.

## 2021-05-29 NOTE — Therapy (Incomplete)
OUTPATIENT PHYSICAL THERAPY TREATMENT NOTE   Patient Name: Robert Ibarra MRN: [id number] DOB:Nov 19, 1985, 36 y.o., male Today's Date: 05/29/2021  PCP: Patient, No Pcp Per (Inactive) REFERRING PROVIDER: Ocie Doyne, MD    Past Medical History:  Diagnosis Date   Asthma    No past surgical history on file. There are no problems to display for this patient.   REFERRING DIAG: Cervicalgia   THERAPY DIAG:  No diagnosis found.  PERTINENT HISTORY: Chronic migraine, smokes- reduced to 2 cigerettes/day from a pack  PRECAUTIONS: None  SUBJECTIVE: ***  PAIN:  Are you having pain? {yes/no:20286} VAS scale: ***/10 Pain location: *** Pain orientation: {Pain Orientation:25161}  PAIN TYPE: {type:313116} Pain description: {PAIN DESCRIPTION:21022940}  Aggravating factors: *** Relieving factors: ***     OBJECTIVE:  *Unless otherwise noted, objective information collected previously*   DIAGNOSTIC FINDINGS:    The discs and interspaces were further evaluated on axial views from C2 to T1 as follows: C2 - C3:  The disc and interspace appear normal. C3 - C4:  The disc and interspace appear normal. C4 - C5:  The disc and interspace appear normal. C5 - C6:  The disc and interspace appear normal. C6 - C7: There is mild disc bulging and mild ligamenta flava hypertrophy causing mild spinal stenosis and mild foraminal narrowing but no nerve root compression.. C7 - T1:  The disc and interspace appear normal.     IMPRESSION: This MRI of the cervical spine without contrast shows the following: 1.   The spinal cord appears normal. 2.   At C6-C7, there is mild spinal stenosis due to mild disc bulging and ligamenta flava hypertrophy but no nerve root compression.   PATIENT SURVEYS:  NA- MCD     COGNITION: Overall cognitive status: Within functional limits for tasks assessed            SENSATION: Light touch: Appears intact, but pt reports decreased quality of sensation with the L arm  and hand      POSTURE:  Forward head, rounded shoulders, forward flexed trunk   PALPATION:           Tender to light palpation           CERVICAL AROM/PROM   A/PROM A/PROM (deg) 05/21/2021  Flexion 20  Extension 16  Right lateral flexion 12  Left lateral flexion 11  Right rotation 14  Left rotation 28   (Blank rows = not tested) - Cervical AROM was limited due to pain. Pt reported provoked L lateral cervical pain c flex, R LF, and R rot and L lateral/post neck and mid back c ext, L LF, and L rot   UE AROM/PROM:                     - Bilat shoulder ROMs were equal and grossly WFLs   UE MMT:   MMT Right 05/21/2021 Left 05/21/2021  Shoulder flexion 3+ 3  Shoulder extension 3+ 3  Shoulder abduction 3+ 3  Shoulder adduction 3+ 3  Shoulder extension 3+ 3  Middle trapezius      Lower trapezius      Elbow flexion 3+ 3  Elbow extension 3+ 3  Wrist flexion 3+ 3  Wrist extension 3+ 3  Wrist ulnar deviation 3+ 3  Wrist radial deviation 3+ 3  Wrist pronation 3+ 3  Wrist supination 3+ 3  Grip strength       (Blank rows = not tested) Gross strength measures above with pt reporting  increased pain with each MMT. The L UE strength was limited in comparison to the R.     CERVICAL SPECIAL TESTS:  Spurling's test: Unable to assess properly with pt's limited cervical AROM with all motions due to pain.     FUNCTIONAL TESTS:  NA   OBSERVATION: Pt completed walking, transfers, neck motions and active movements of his UE at a decreased pace  [facility] Adult PT Treatment:                                                DATE: 05/29/2021 Therapeutic Exercise: *** Manual Therapy: *** Neuromuscular re-ed: *** Therapeutic Activity: *** Modalities: *** Self Care: ***    EVAL TREATMENT:  Cervical retraction x10, 3" Scapular retraction x10, 3"     PATIENT EDUCATION:  Education details: Eval findings, POC, HEP Person educated: Patient Education method: Explanation,  Demonstration, Tactile cues, Verbal cues, and Handouts Education comprehension: verbalized understanding, returned demonstration, verbal cues required, and tactile cues required     HOME EXERCISE PROGRAM: Access Code: [email or web address] URL: [email or web address] Date: 05/20/2021 Prepared by: Joellyn Rued   Exercises Seated Passive Cervical Retraction - 4 x daily - 7 x weekly - 1 sets - 10 reps - 3 hold Seated Scapular Retraction - 4 x daily - 7 x weekly - 1 sets - 10 reps - 3 hold     ASSESSMENT:   CLINICAL IMPRESSION: ***   REHAB POTENTIAL: Fair due the chronicity of pt's condition   CLINICAL DECISION MAKING: Stable/uncomplicated   EVALUATION COMPLEXITY: Low     GOALS:     SHORT TERM GOALS:   STG Name Target Date Goal status  1 Pt will be Ind in a HEP to address deficits and reduce pain Baseline:  06/11/21 INITIAL  2 Pt will voice understanding of measures to assist in the management and reduction of pain Baseline:  06/11/21 INITIAL      LONG TERM GOALS:    LTG Name Target Date Goal status  1 Increase cervical ROM by 15d for all motions for improve neck function Baseline:see flowsheets 07/02/21 INITIAL  2 Increase UE strength to 4+/5 for improved functional use with daily activities Baseline:L 3/5, R 3+/5 07/02/21 INITIAL  3 Pt will report a decrease in neck and upper shoulder pain to 6/10 or less with daily activities Baseline:5-10/10 07/02/21 INITIAL  4 Pt will be able to obtain and maintain proper sitting posture for 5 mins Baseline: Forward head, rounded shoulders, FF trunk 07/02/21 INITIAL  5 Pt will be Ind in a final HEP to maintain achieved LOF Baseline: 07/02/21 INITIAL      PLAN: PT FREQUENCY: 2x/week   PT DURATION: 6 weeks   PLANNED INTERVENTIONS: Therapeutic exercises, Therapeutic activity, Neuro Muscular re-education, Balance training, Gait training, Patient/Family education, Joint mobilization, Dry Needling, Electrical stimulation, Spinal  mobilization, Cryotherapy, Moist heat, Taping, Vasopneumatic device, Traction, Ultrasound, Biofeedback, and Manual therapy   PLAN FOR NEXT SESSION: Assess pt's response to HEP. Progress therex as indicated. Use of manual therapy and modalities as indicated.    Margaretha Sheffield Madylin Fairbank 05/29/2021, 7:58 AM

## 2021-06-01 ENCOUNTER — Ambulatory Visit: Payer: Self-pay

## 2021-06-02 ENCOUNTER — Other Ambulatory Visit: Payer: Self-pay

## 2021-06-02 ENCOUNTER — Ambulatory Visit: Payer: Medicaid Other

## 2021-06-02 DIAGNOSIS — M542 Cervicalgia: Secondary | ICD-10-CM

## 2021-06-02 DIAGNOSIS — M6281 Muscle weakness (generalized): Secondary | ICD-10-CM

## 2021-06-02 DIAGNOSIS — R293 Abnormal posture: Secondary | ICD-10-CM

## 2021-06-02 DIAGNOSIS — R29898 Other symptoms and signs involving the musculoskeletal system: Secondary | ICD-10-CM

## 2021-06-02 NOTE — Therapy (Addendum)
OUTPATIENT PHYSICAL THERAPY TREATMENT NOTE/ DISCHARGE SUMMARY   Patient Name: Robert Ibarra MRN: [id number] DOB:1985-06-04, 36 y.o., male Today's Date: 06/02/2021  PCP: Patient, No Pcp Per (Inactive) REFERRING PROVIDER: Genia Harold, MD   PT End of Session - 06/02/21 1834     Visit Number 2    Number of Visits 13    Date for PT Re-Evaluation 07/09/21    Authorization Type MEDICAID [place]-FAMILY PLANNING    PT Start Time 1835    PT Stop Time 1915    PT Time Calculation (min) 40 min    Activity Tolerance Patient limited by pain    Behavior During Therapy [facility] for tasks assessed/performed             Past Medical History:  Diagnosis Date   Asthma    History reviewed. No pertinent surgical history. There are no problems to display for this patient.   REFERRING DIAG: Cervicalgia  THERAPY DIAG:  Cervicalgia  Decreased ROM of neck  Muscle weakness (generalized)  Abnormal posture  PERTINENT HISTORY: Chronic migraine, smokes- reduced to 2 cigerettes/day from a pack  PRECAUTIONS: None  SUBJECTIVE: Pt reports 0/10 pain currently, adding that he has been able to do his HEP regularly.  PAIN:  Are you having pain? No NPRS scale: 0/10 Pain location: Neck      OBJECTIVE:  *Unless otherwise noted, objective information collected previously*   DIAGNOSTIC FINDINGS:    The discs and interspaces were further evaluated on axial views from C2 to T1 as follows: C2 - C3:  The disc and interspace appear normal. C3 - C4:  The disc and interspace appear normal. C4 - C5:  The disc and interspace appear normal. C5 - C6:  The disc and interspace appear normal. C6 - C7: There is mild disc bulging and mild ligamenta flava hypertrophy causing mild spinal stenosis and mild foraminal narrowing but no nerve root compression.. C7 - T1:  The disc and interspace appear normal.     IMPRESSION: This MRI of the cervical spine without contrast shows the following: 1.   The spinal cord  appears normal. 2.   At C6-C7, there is mild spinal stenosis due to mild disc bulging and ligamenta flava hypertrophy but no nerve root compression.   PATIENT SURVEYS:  NA- MCD     COGNITION: Overall cognitive status: Within functional limits for tasks assessed            SENSATION: Light touch: Appears intact, but pt reports decreased quality of sensation with the L arm and hand      POSTURE:  Forward head, rounded shoulders, forward flexed trunk   PALPATION:           Tender to light palpation           CERVICAL AROM/PROM   A/PROM A/PROM (deg) 05/21/2021  Flexion 20  Extension 16  Right lateral flexion 12  Left lateral flexion 11  Right rotation 14  Left rotation 28   (Blank rows = not tested) - Cervical AROM was limited due to pain. Pt reported provoked L lateral cervical pain c flex, R LF, and R rot and L lateral/post neck and mid back c ext, L LF, and L rot   UE AROM/PROM:                     - Bilat shoulder ROMs were equal and grossly WFLs   UE MMT:   MMT Right 05/21/2021 Left 05/21/2021  Shoulder flexion 3+  3  Shoulder extension 3+ 3  Shoulder abduction 3+ 3  Shoulder adduction 3+ 3  Shoulder extension 3+ 3  Middle trapezius      Lower trapezius      Elbow flexion 3+ 3  Elbow extension 3+ 3  Wrist flexion 3+ 3  Wrist extension 3+ 3  Wrist ulnar deviation 3+ 3  Wrist radial deviation 3+ 3  Wrist pronation 3+ 3  Wrist supination 3+ 3  Grip strength       (Blank rows = not tested) Gross strength measures above with pt reporting increased pain with each MMT. The L UE strength was limited in comparison to the R.     CERVICAL SPECIAL TESTS:  Spurling's test: Unable to assess properly with pt's limited cervical AROM with all motions due to pain.     FUNCTIONAL TESTS:  NA   OBSERVATION: Pt completed walking, transfers, neck motions and active movements of his UE at a decreased pace   TODAY'S TREATMENT:   [facility] Adult PT Treatment:                                                 DATE: 06/02/2021 Therapeutic Exercise: Cervical rotation isotonic contractions against handhold resistance 2x10 BIL Seated low rows with 30# cable 2x10 Seated high rows with 30# cable 2x10 Seated lat pulldowns with 30# cable 2x10 Supine DNF endurance 5x to exhaustion Supine chin tuck with rotation 2x10 BIL Low trap lift-off at wall 3x10 Forward/backward shoulder rolls 2x20 Push-up plus at wall 3x10 Manual Therapy: N/A Neuromuscular re-ed: N/A Therapeutic Activity: N/A Modalities: N/A Self Care: N/A   Eval Treatment: Cervical retraction x10, 3" Scapular retraction x10, 3"     PATIENT EDUCATION:  Education details: Eval findings, POC, HEP Person educated: Patient Education method: Explanation, Demonstration, Tactile cues, Verbal cues, and Handouts Education comprehension: verbalized understanding, returned demonstration, verbal cues required, and tactile cues required     HOME EXERCISE PROGRAM: Access Code: [email or web address] URL: [email or web address] Date: 05/20/2021 Prepared by: Gar Ponto   Exercises Seated Passive Cervical Retraction - 4 x daily - 7 x weekly - 1 sets - 10 reps - 3 hold Seated Scapular Retraction - 4 x daily - 7 x weekly - 1 sets - 10 reps - 3 hold     ASSESSMENT:   CLINICAL IMPRESSION: Pt responded well to all interventions today, demonstrating good form and no increase in pain with selected exercises. He reports a therapeutic response to cervical rotation isometrics. The pt demonstrates visually improved cervical AROM when compared to eval objective data. He will continue to benefit from skilled PT to address his primary impairments and return to his prior level of function with less limitation.   REHAB POTENTIAL: Fair due the chronicity of pt's condition   CLINICAL DECISION MAKING: Stable/uncomplicated   EVALUATION COMPLEXITY: Low     GOALS:     SHORT TERM GOALS:   STG Name Target Date Goal status   1 Pt will be Ind in a HEP to address deficits and reduce pain Baseline:  06/11/21 INITIAL  2 Pt will voice understanding of measures to assist in the management and reduction of pain Baseline:  06/11/21 INITIAL      LONG TERM GOALS:    LTG Name Target Date Goal status  1 Increase cervical ROM by 15d for all motions for improve neck function  Baseline:see flowsheets 07/02/21 INITIAL  2 Increase UE strength to 4+/5 for improved functional use with daily activities Baseline:L 3/5, R 3+/5 07/02/21 INITIAL  3 Pt will report a decrease in neck and upper shoulder pain to 6/10 or less with daily activities Baseline:5-10/10 07/02/21 INITIAL  4 Pt will be able to obtain and maintain proper sitting posture for 5 mins Baseline: Forward head, rounded shoulders, FF trunk 07/02/21 INITIAL  5 Pt will be Ind in a final HEP to maintain achieved LOF Baseline: 07/02/21 INITIAL      PLAN: PT FREQUENCY: 2x/week   PT DURATION: 6 weeks   PLANNED INTERVENTIONS: Therapeutic exercises, Therapeutic activity, Neuro Muscular re-education, Balance training, Gait training, Patient/Family education, Joint mobilization, Dry Needling, Electrical stimulation, Spinal mobilization, Cryotherapy, Moist heat, Taping, Vasopneumatic device, Traction, Ultrasound, Biofeedback, and Manual therapy   PLAN FOR NEXT SESSION: Progress DNF/ parascapular strengthening. Use of manual therapy and modalities as indicated.    Vanessa [place], PT, DPT 06/02/21 7:13 PM  PHYSICAL THERAPY DISCHARGE SUMMARY  Visits from Start of Care: 2  Current functional level related to goals / functional outcomes: Unable to assess   Remaining deficits: Unable to assess   Education / Equipment: HEP   Patient agrees to discharge. Patient goals were not met. Patient is being discharged due to not returning since the last visit.  Vanessa [place], PT, DPT 07/22/21 3:20 PM

## 2021-06-03 ENCOUNTER — Ambulatory Visit: Payer: Self-pay

## 2021-06-08 ENCOUNTER — Ambulatory Visit: Payer: Self-pay

## 2021-06-15 ENCOUNTER — Ambulatory Visit: Payer: Self-pay

## 2021-06-22 ENCOUNTER — Ambulatory Visit: Payer: Self-pay

## 2021-06-24 ENCOUNTER — Ambulatory Visit: Payer: Medicaid Other

## 2021-09-09 DIAGNOSIS — Z0271 Encounter for disability determination: Secondary | ICD-10-CM

## 2021-12-26 IMAGING — CT CT HEAD W/O CM
3 series · 15 of 47 positions shown, 18 images · non-contrast
Comparison: CT head September 02, 2016.

CLINICAL DATA: headache

EXAM:
CT HEAD WITHOUT CONTRAST
TECHNIQUE: Contiguous axial images were obtained from the base of the skull
through the vertex without intravenous contrast.

[Series 2: head wo · axial · 0.45mm/px · z∈[-24,+106]mm · 9 of 32 slices shown, 12 images]
[im 3/32  brain]
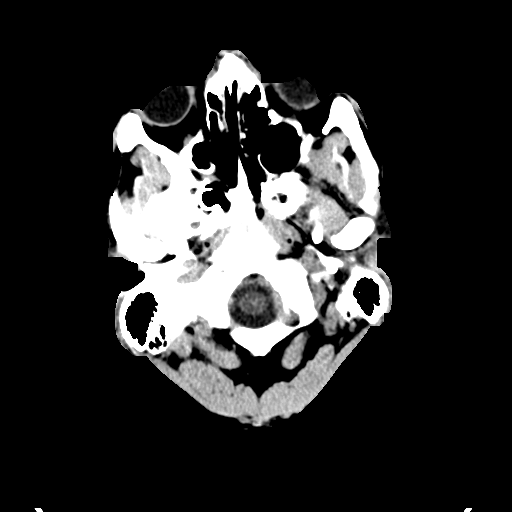
[im 3/32  bone]
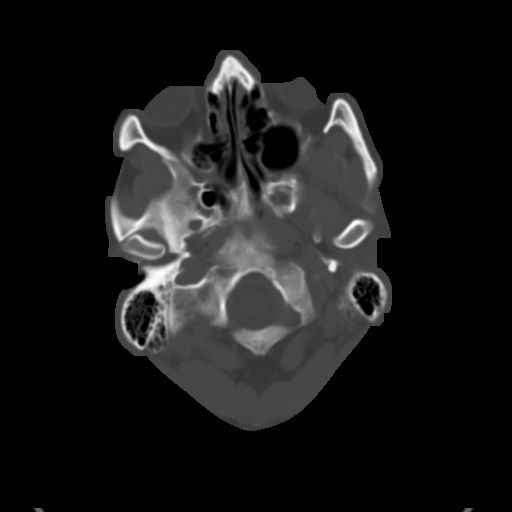
[im 6/32  brain]
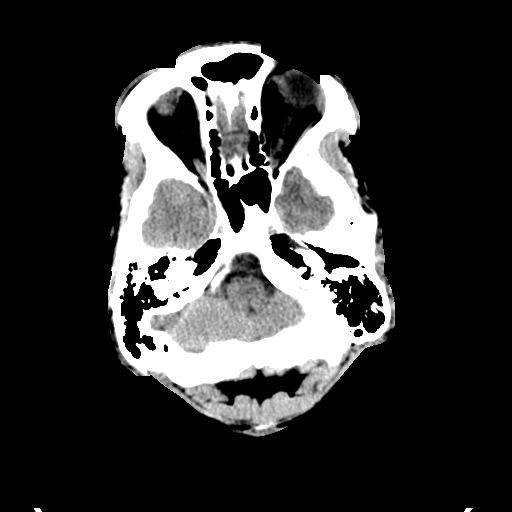
[im 9/32  brain]
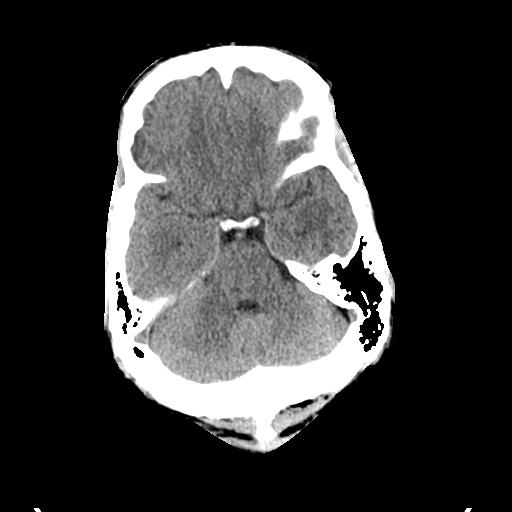
[im 12/32  brain]
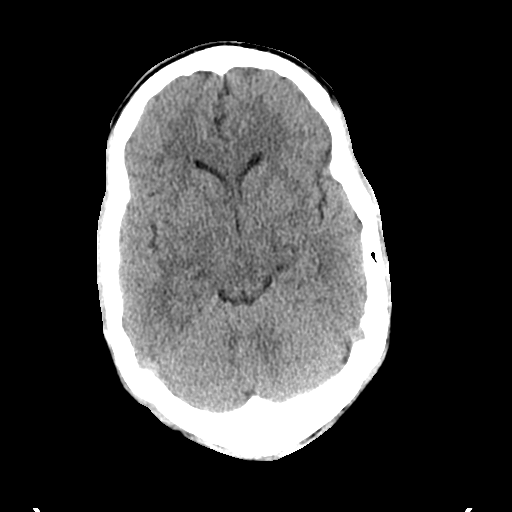
[im 17/32  brain]
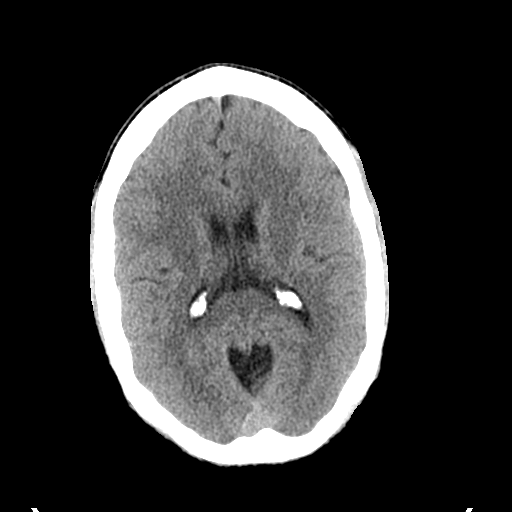
[im 17/32  bone]
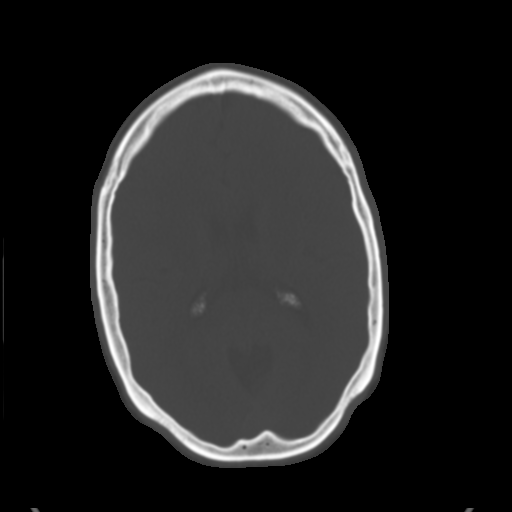
[im 20/32  brain]
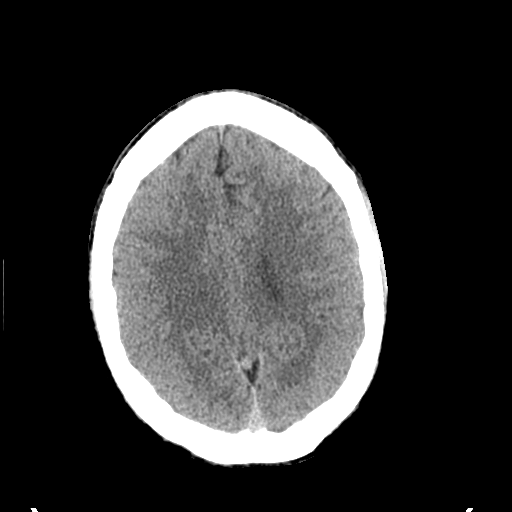
[im 23/32  brain]
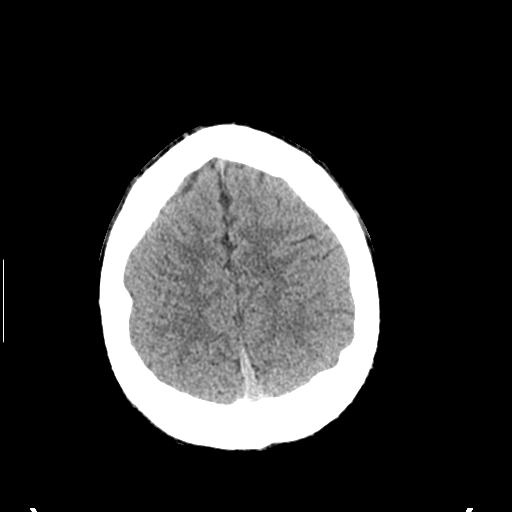
[im 26/32  brain]
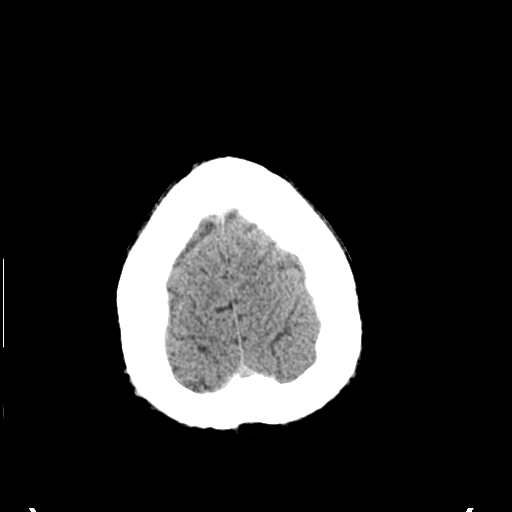
[im 29/32  brain]
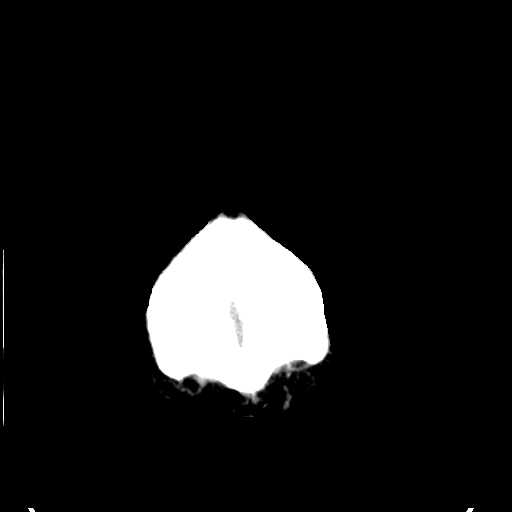
[im 29/32  bone]
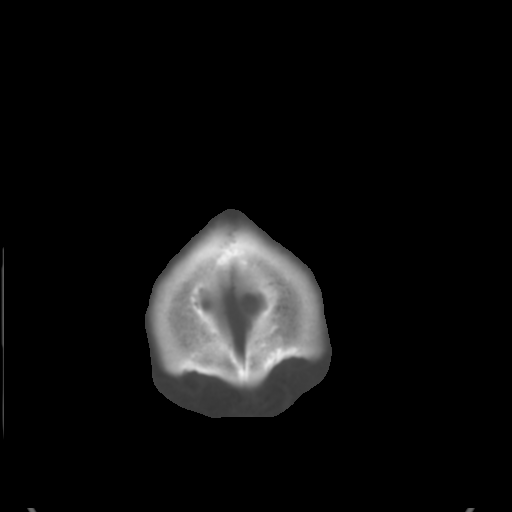

[Series 5: coronal soft tissue · coronal · 0.33mm/px · 3 of 70 slices shown]
[im 24/70  brain]
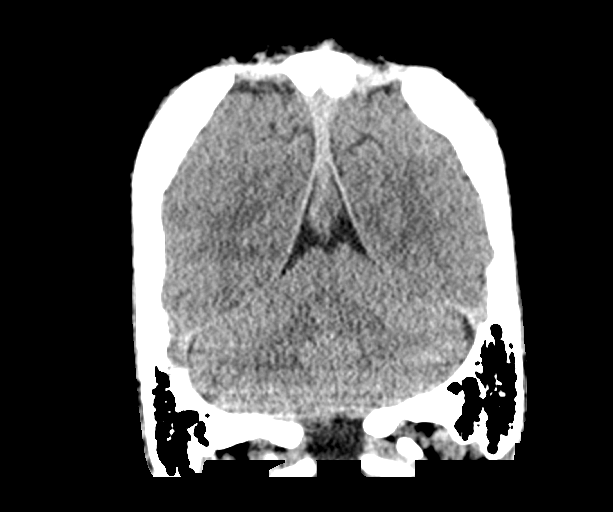
[im 31/70  brain]
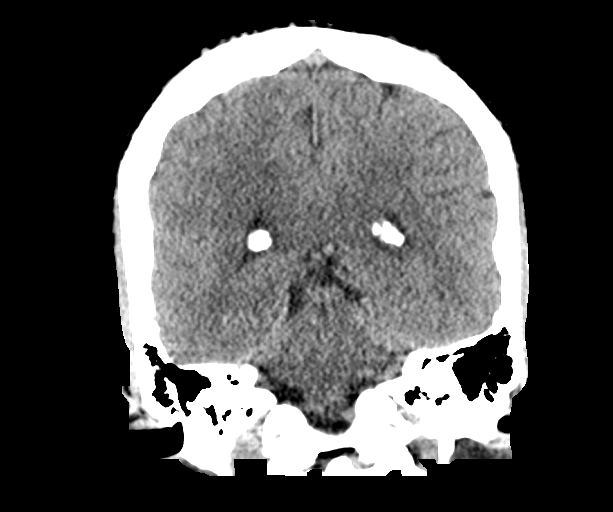
[im 39/70  brain]
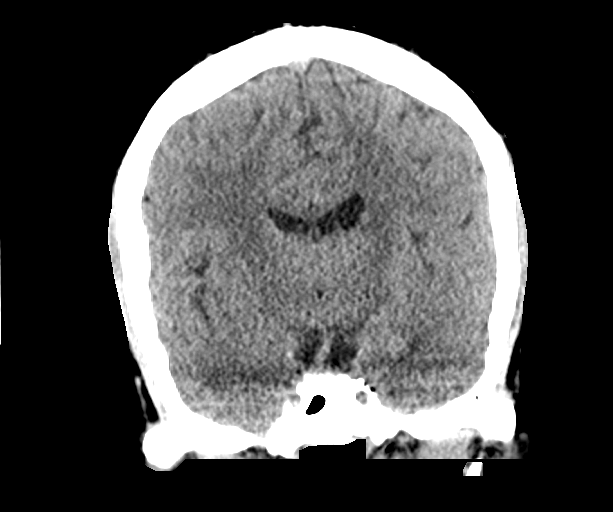

[Series 6: sagittal soft tissue · sagittal · 0.32mm/px · 3 of 63 slices shown]
[im 21/63  brain]
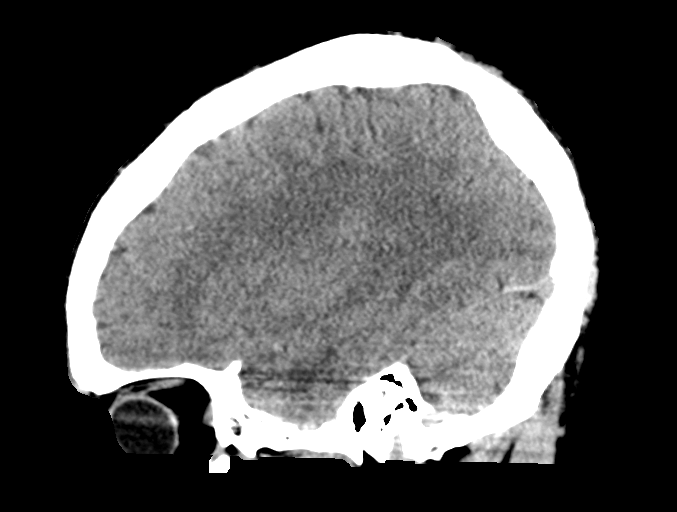
[im 32/63  brain]
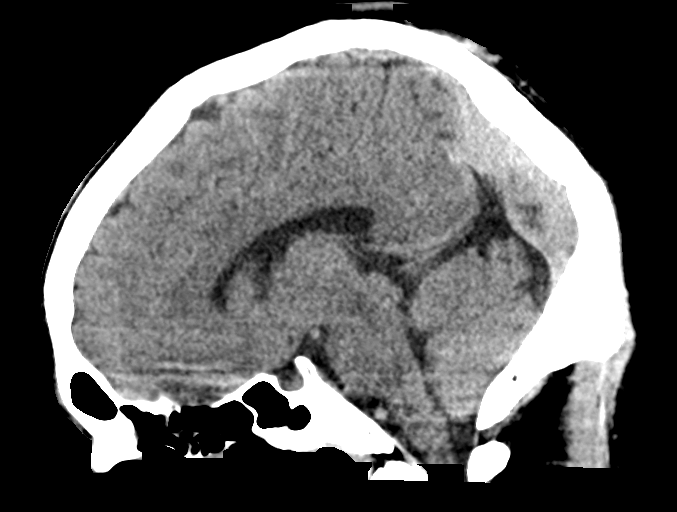
[im 42/63  brain]
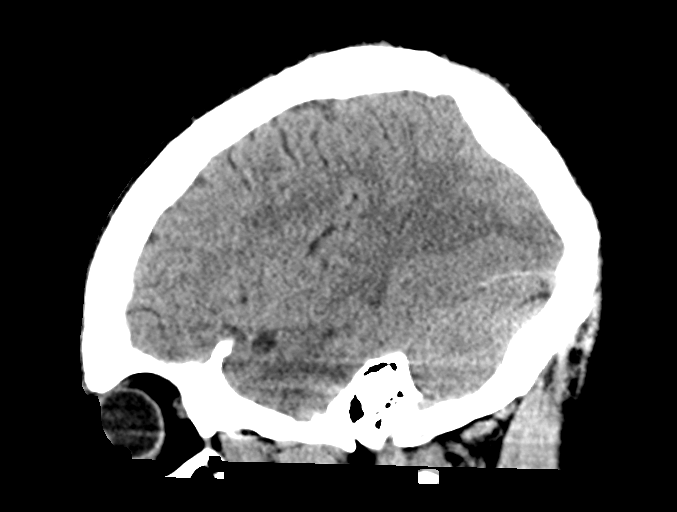

[15 of 47 positions shown; findings below may reference images not displayed]

FINDINGS: Brain: No evidence of acute infarction, hemorrhage, hydrocephalus,
extra-axial collection or mass lesion/mass effect.

Vascular: No hyperdense vessel identified.

Skull: Similar enlarged parietal foramina.  No acute fracture.

Sinuses/Orbits: Frothy secretions in the partially imaged right
maxillary sinus. Remote right medial and inferior orbital wall
fractures, partially imaged.

Other: No mastoid effusions.
IMPRESSION: 1. No evidence of acute intracranial abnormality.
2. Partially imaged frothy secretions in the right maxillary sinus.
Correlate with signs/symptoms of sinusitis.

## 2022-04-27 ENCOUNTER — Encounter (HOSPITAL_COMMUNITY): Payer: Self-pay | Admitting: Emergency Medicine

## 2022-04-27 ENCOUNTER — Ambulatory Visit (HOSPITAL_COMMUNITY)
Admission: EM | Admit: 2022-04-27 | Discharge: 2022-04-27 | Discharge status: Against Medical Advice | Payer: Medicaid Other | Attending: Physician Assistant | Admitting: Physician Assistant

## 2022-04-27 ENCOUNTER — Emergency Department (HOSPITAL_COMMUNITY)
Admission: EM | Admit: 2022-04-27 | Discharge: 2022-04-27 | Discharge status: Against Medical Advice | Payer: Medicaid Other

## 2022-04-27 DIAGNOSIS — R079 Chest pain, unspecified: Secondary | ICD-10-CM | POA: Diagnosis not present

## 2022-04-27 DIAGNOSIS — Z5321 Procedure and treatment not carried out due to patient leaving prior to being seen by health care provider: Secondary | ICD-10-CM

## 2022-04-27 NOTE — ED Triage Notes (Signed)
Pt reports having left arm numbness and sharp pains that will shoot through it for a couple days. Thought related to job at work. Last night pains started through arm and around to left side of chest.

## 2022-04-27 NOTE — ED Notes (Signed)
Patient did not answer in lobby.  Patient access reports patient has been going in and out of building

## 2022-04-27 NOTE — ED Notes (Signed)
Called from lobby to take to treatment room but no answer from lobby

## 2022-04-27 NOTE — ED Notes (Signed)
Robert Ibarra, patient access called patient.  Patient has left and not returning according to message

## 2022-05-19 ENCOUNTER — Telehealth: Payer: Self-pay

## 2022-05-19 NOTE — Telephone Encounter (Signed)
LVM. AS, CMA 

## 2022-07-09 ENCOUNTER — Encounter (HOSPITAL_COMMUNITY): Payer: Self-pay

## 2022-07-09 ENCOUNTER — Emergency Department (HOSPITAL_COMMUNITY)
Admission: EM | Admit: 2022-07-09 | Discharge: 2022-07-09 | Discharge status: Against Medical Advice | Payer: Medicaid Other | Attending: Emergency Medicine | Admitting: Emergency Medicine

## 2022-07-09 ENCOUNTER — Other Ambulatory Visit: Payer: Self-pay

## 2022-07-09 DIAGNOSIS — Z5329 Procedure and treatment not carried out because of patient's decision for other reasons: Secondary | ICD-10-CM | POA: Diagnosis not present

## 2022-07-09 DIAGNOSIS — F10920 Alcohol use, unspecified with intoxication, uncomplicated: Secondary | ICD-10-CM | POA: Diagnosis not present

## 2022-07-09 DIAGNOSIS — R569 Unspecified convulsions: Secondary | ICD-10-CM | POA: Insufficient documentation

## 2022-07-09 DIAGNOSIS — J45909 Unspecified asthma, uncomplicated: Secondary | ICD-10-CM | POA: Insufficient documentation

## 2022-07-09 DIAGNOSIS — F17210 Nicotine dependence, cigarettes, uncomplicated: Secondary | ICD-10-CM | POA: Diagnosis not present

## 2022-07-09 NOTE — ED Notes (Signed)
Pt refused temp

## 2022-07-09 NOTE — ED Provider Notes (Signed)
[place] DEPT Provider Note: Georgena Spurling, MD, FACEP  CSN: [id number] MRN: [id number] ARRIVAL: 07/09/22 at [place]: [place]  Seizures   HISTORY OF PRESENT ILLNESS  07/09/22 1:19 AM Robert Ibarra is a 37 y.o. male who was brought in by his friend after he had 2 witnessed seizures about an hour prior to arrival.  He had been drinking alcohol.  He often has seizures when he drinks alcohol.  He does not take any seizure medication.  His only complaint is that he is hungry and wants to leave.  He is having a headache which she rates as a 4 out of 10.  He is awake, alert and oriented.     Past Medical History:  Diagnosis Date   Asthma     History reviewed. No pertinent surgical history.  Family History  Problem Relation Age of Onset   Diabetes Father     Social History   Tobacco Use   Smoking status: Some Days    Packs/day: 0.50    Types: Cigarettes   Smokeless tobacco: Never  Substance Use Topics   Alcohol use: Yes    Comment: occasionally   Drug use: Yes    Types: Marijuana    Comment: occasionally    Prior to Admission medications   Medication Sig Start Date End Date Taking? Authorizing Provider  amitriptyline (ELAVIL) 25 MG tablet Take 1 tablet (25 mg total) by mouth at bedtime. 04/05/21   Genia Harold, MD  benzonatate (TESSALON) 100 MG capsule Take 1 capsule (100 mg total) by mouth every 8 (eight) hours. 04/12/19   Domenic Moras, PA-C  ciprofloxacin (CIPRO) 500 MG tablet Take 1 tablet (500 mg total) by mouth 2 (two) times daily. 12/16/18   Lajean Saver, MD  DM-Phenylephrine-Acetaminophen (VICKS DAYQUIL COLD & FLU) 10-5-325 MG/15ML LIQD Take 2 capsules by mouth every 6 (six) hours as needed (cold symptoms).    [provider]  ibuprofen (ADVIL) 600 MG tablet Take 1 tablet (600 mg total) by mouth every 6 (six) hours as needed. 04/27/20   Charlann Lange, PA-C  methocarbamol (ROBAXIN) 500 MG tablet Take 1 tablet (500 mg total) by  mouth as needed for muscle spasms. 03/25/21   Caccavale, Sophia, PA-C  methylPREDNISolone (MEDROL DOSEPAK) 4 MG TBPK tablet Take as directed by packaging 04/05/21   Genia Harold, MD  naproxen (NAPROSYN) 500 MG tablet Take 1 tablet (500 mg total) by mouth 2 (two) times daily with a meal. 03/25/21   Caccavale, Sophia, PA-C  ondansetron (ZOFRAN) 4 MG tablet Take 1 tablet (4 mg total) by mouth every 6 (six) hours. 04/12/19   Domenic Moras, PA-C  oxyCODONE-acetaminophen (PERCOCET/ROXICET) 5-325 MG tablet Take 1 tablet by mouth every 4 (four) hours as needed for severe pain. 06/01/20   Charlann Lange, PA-C  traMADol (ULTRAM) 50 MG tablet Take 1 tablet (50 mg total) by mouth every 6 (six) hours as needed. 12/16/18   Lajean Saver, MD    Allergies Patient has no known allergies.   REVIEW OF SYSTEMS  Negative except as noted here or in the History of Present Illness.   PHYSICAL EXAMINATION  Initial Vital Signs Blood pressure 110/72, pulse 98, resp. rate 19, height 5' 4"$  (1.626 m), weight 68 kg, SpO2 99 %.  Examination General: Well-developed, well-nourished male in no acute distress; appearance consistent with age of record HENT: normocephalic; atraumatic Eyes: Normal appearance Neck: supple Heart: regular rate and rhythm Lungs: clear to auscultation bilaterally Abdomen: soft; nondistended; nontender;  bowel sounds present Extremities: No deformity; full range of motion Neurologic: Awake, alert and oriented; motor function intact in all extremities and symmetric; no facial droop Skin: Warm and dry Psychiatric: Uncooperative and argumentative but not violent or aggressive; appears intoxicated   RESULTS  Summary of this visit's results, reviewed and interpreted by myself:   EKG Interpretation  Date/Time:    Ventricular Rate:    PR Interval:    QRS Duration:   QT Interval:    QTC Calculation:   R Axis:     Text Interpretation:         Laboratory Studies: No results found for this  or any previous visit (from the past 24 hour(s)). Imaging Studies: No results found.  ED COURSE and MDM  Nursing notes, initial and subsequent vitals signs, including pulse oximetry, reviewed and interpreted by myself.  Vitals:   07/09/22 0104 07/09/22 0107  BP:  110/72  Pulse:  98  Resp:  19  SpO2:  99%  Weight: 68 kg   Height: 5' 4"$  (1.626 m)    Medications - No data to display  This patient states there is nothing wrong with him.  He does not wish to be here.  He wants to go eat something somewhere.  He does not wish to be treated for his seizures.  I do not see any indication for involuntarily committing him as he does not appear to be a danger to himself or others at this time.  He was advised he would benefit from treatment for his seizures but he was not interested in receiving treatment.  1:23 AM Patient eloped while I was working on this document.Marland Kitchen  PROCEDURES  Procedures   ED DIAGNOSES     ICD-10-CM   1. Seizures ([place])  R56.9     2. Alcoholic intoxication without complication ([place])  0000000          Kayne Yuhas, Jenny Reichmann, MD 07/09/22 [id number]

## 2022-07-09 NOTE — ED Triage Notes (Signed)
Pt had two witnessed seizures about 1 hour PTA. ETOH on board.

## 2022-07-09 NOTE — ED Notes (Addendum)
Pt refusing care. Pt alert and oriented x4. Pt requesting to leave AMA.

## 2022-09-26 ENCOUNTER — Telehealth: Payer: Self-pay

## 2022-09-26 NOTE — Telephone Encounter (Signed)
LVM for patient to call back. AS, CMA 

## 2022-11-18 DIAGNOSIS — G4089 Other seizures: Secondary | ICD-10-CM | POA: Diagnosis not present

## 2022-11-18 DIAGNOSIS — J4520 Mild intermittent asthma, uncomplicated: Secondary | ICD-10-CM | POA: Diagnosis not present

## 2022-11-18 DIAGNOSIS — G8191 Hemiplegia, unspecified affecting right dominant side: Secondary | ICD-10-CM | POA: Diagnosis not present

## 2022-11-18 DIAGNOSIS — G8384 Todd's paralysis (postepileptic): Secondary | ICD-10-CM | POA: Diagnosis not present

## 2022-11-18 DIAGNOSIS — R519 Headache, unspecified: Secondary | ICD-10-CM | POA: Diagnosis not present

## 2022-11-18 DIAGNOSIS — G40909 Epilepsy, unspecified, not intractable, without status epilepticus: Secondary | ICD-10-CM | POA: Diagnosis not present

## 2022-11-18 DIAGNOSIS — R079 Chest pain, unspecified: Secondary | ICD-10-CM | POA: Diagnosis not present

## 2022-11-18 DIAGNOSIS — R509 Fever, unspecified: Secondary | ICD-10-CM | POA: Diagnosis not present

## 2022-11-18 DIAGNOSIS — H547 Unspecified visual loss: Secondary | ICD-10-CM | POA: Diagnosis not present

## 2022-11-18 DIAGNOSIS — R569 Unspecified convulsions: Secondary | ICD-10-CM | POA: Diagnosis not present

## 2022-11-18 DIAGNOSIS — E860 Dehydration: Secondary | ICD-10-CM | POA: Diagnosis not present

## 2022-11-18 DIAGNOSIS — E876 Hypokalemia: Secondary | ICD-10-CM | POA: Diagnosis not present

## 2022-11-18 DIAGNOSIS — F17200 Nicotine dependence, unspecified, uncomplicated: Secondary | ICD-10-CM | POA: Diagnosis not present

## 2022-11-18 DIAGNOSIS — I69351 Hemiplegia and hemiparesis following cerebral infarction affecting right dominant side: Secondary | ICD-10-CM | POA: Diagnosis not present

## 2022-11-19 DIAGNOSIS — R569 Unspecified convulsions: Secondary | ICD-10-CM | POA: Diagnosis not present

## 2022-11-19 DIAGNOSIS — E860 Dehydration: Secondary | ICD-10-CM | POA: Diagnosis not present

## 2022-11-19 DIAGNOSIS — R531 Weakness: Secondary | ICD-10-CM | POA: Diagnosis not present

## 2022-11-21 DIAGNOSIS — G40409 Other generalized epilepsy and epileptic syndromes, not intractable, without status epilepticus: Secondary | ICD-10-CM | POA: Diagnosis not present

## 2022-11-25 DIAGNOSIS — R569 Unspecified convulsions: Secondary | ICD-10-CM | POA: Diagnosis not present

## 2022-11-25 DIAGNOSIS — Z1159 Encounter for screening for other viral diseases: Secondary | ICD-10-CM | POA: Diagnosis not present

## 2022-11-25 DIAGNOSIS — R7303 Prediabetes: Secondary | ICD-10-CM | POA: Diagnosis not present

## 2022-11-25 DIAGNOSIS — Z1329 Encounter for screening for other suspected endocrine disorder: Secondary | ICD-10-CM | POA: Diagnosis not present

## 2022-11-25 DIAGNOSIS — Z6824 Body mass index (BMI) 24.0-24.9, adult: Secondary | ICD-10-CM | POA: Diagnosis not present

## 2022-11-25 DIAGNOSIS — Z13220 Encounter for screening for lipoid disorders: Secondary | ICD-10-CM | POA: Diagnosis not present

## 2022-11-25 DIAGNOSIS — Z79899 Other long term (current) drug therapy: Secondary | ICD-10-CM | POA: Diagnosis not present

## 2022-11-25 DIAGNOSIS — F17200 Nicotine dependence, unspecified, uncomplicated: Secondary | ICD-10-CM | POA: Diagnosis not present

## 2022-11-25 DIAGNOSIS — Z7689 Persons encountering health services in other specified circumstances: Secondary | ICD-10-CM | POA: Diagnosis not present

## 2022-12-22 DIAGNOSIS — Z6824 Body mass index (BMI) 24.0-24.9, adult: Secondary | ICD-10-CM | POA: Diagnosis not present

## 2022-12-22 DIAGNOSIS — R569 Unspecified convulsions: Secondary | ICD-10-CM | POA: Diagnosis not present

## 2022-12-22 DIAGNOSIS — F17200 Nicotine dependence, unspecified, uncomplicated: Secondary | ICD-10-CM | POA: Diagnosis not present

## 2022-12-22 DIAGNOSIS — Z713 Dietary counseling and surveillance: Secondary | ICD-10-CM | POA: Diagnosis not present

## 2023-02-21 DIAGNOSIS — Z7189 Other specified counseling: Secondary | ICD-10-CM | POA: Diagnosis not present

## 2023-02-21 DIAGNOSIS — F17200 Nicotine dependence, unspecified, uncomplicated: Secondary | ICD-10-CM | POA: Diagnosis not present

## 2023-02-21 DIAGNOSIS — R7303 Prediabetes: Secondary | ICD-10-CM | POA: Diagnosis not present

## 2023-02-21 DIAGNOSIS — D75839 Thrombocytosis, unspecified: Secondary | ICD-10-CM | POA: Diagnosis not present

## 2023-02-21 DIAGNOSIS — Z713 Dietary counseling and surveillance: Secondary | ICD-10-CM | POA: Diagnosis not present

## 2023-02-21 DIAGNOSIS — Z6825 Body mass index (BMI) 25.0-25.9, adult: Secondary | ICD-10-CM | POA: Diagnosis not present

## 2023-02-21 DIAGNOSIS — R569 Unspecified convulsions: Secondary | ICD-10-CM | POA: Diagnosis not present

## 2023-03-08 DIAGNOSIS — H5213 Myopia, bilateral: Secondary | ICD-10-CM | POA: Diagnosis not present

## 2023-04-25 DIAGNOSIS — R569 Unspecified convulsions: Secondary | ICD-10-CM | POA: Diagnosis not present

## 2023-04-26 DIAGNOSIS — R569 Unspecified convulsions: Secondary | ICD-10-CM | POA: Diagnosis not present

## 2023-06-19 DIAGNOSIS — G40909 Epilepsy, unspecified, not intractable, without status epilepticus: Secondary | ICD-10-CM | POA: Diagnosis not present

## 2023-06-19 DIAGNOSIS — J101 Influenza due to other identified influenza virus with other respiratory manifestations: Secondary | ICD-10-CM | POA: Diagnosis not present

## 2023-06-19 DIAGNOSIS — R7303 Prediabetes: Secondary | ICD-10-CM | POA: Diagnosis not present

## 2023-06-19 DIAGNOSIS — Z20822 Contact with and (suspected) exposure to covid-19: Secondary | ICD-10-CM | POA: Diagnosis not present

## 2023-06-19 DIAGNOSIS — Z720 Tobacco use: Secondary | ICD-10-CM | POA: Diagnosis not present

## 2023-07-13 DIAGNOSIS — B9789 Other viral agents as the cause of diseases classified elsewhere: Secondary | ICD-10-CM | POA: Diagnosis not present

## 2023-07-13 DIAGNOSIS — R0602 Shortness of breath: Secondary | ICD-10-CM | POA: Diagnosis not present

## 2023-07-13 DIAGNOSIS — J069 Acute upper respiratory infection, unspecified: Secondary | ICD-10-CM | POA: Diagnosis not present

## 2023-07-13 DIAGNOSIS — Z20822 Contact with and (suspected) exposure to covid-19: Secondary | ICD-10-CM | POA: Diagnosis not present

## 2023-07-14 DIAGNOSIS — I2510 Atherosclerotic heart disease of native coronary artery without angina pectoris: Secondary | ICD-10-CM | POA: Diagnosis not present

## 2023-11-26 DIAGNOSIS — E876 Hypokalemia: Secondary | ICD-10-CM | POA: Diagnosis not present

## 2023-11-26 DIAGNOSIS — R197 Diarrhea, unspecified: Secondary | ICD-10-CM | POA: Diagnosis not present

## 2023-11-26 DIAGNOSIS — R112 Nausea with vomiting, unspecified: Secondary | ICD-10-CM | POA: Diagnosis not present

## 2024-01-23 DIAGNOSIS — D72810 Lymphocytopenia: Secondary | ICD-10-CM | POA: Diagnosis not present

## 2024-01-23 DIAGNOSIS — J45901 Unspecified asthma with (acute) exacerbation: Secondary | ICD-10-CM | POA: Diagnosis not present

## 2024-01-23 DIAGNOSIS — U071 COVID-19: Secondary | ICD-10-CM | POA: Diagnosis not present

## 2024-01-23 DIAGNOSIS — R0902 Hypoxemia: Secondary | ICD-10-CM | POA: Diagnosis not present

## 2024-01-23 DIAGNOSIS — F17290 Nicotine dependence, other tobacco product, uncomplicated: Secondary | ICD-10-CM | POA: Diagnosis not present

## 2024-01-23 DIAGNOSIS — R0602 Shortness of breath: Secondary | ICD-10-CM | POA: Diagnosis not present

## 2024-01-23 DIAGNOSIS — R000 Tachycardia, unspecified: Secondary | ICD-10-CM | POA: Diagnosis not present

## 2024-01-24 DIAGNOSIS — R9431 Abnormal electrocardiogram [ECG] [EKG]: Secondary | ICD-10-CM | POA: Diagnosis not present

## 2024-02-08 DIAGNOSIS — R7303 Prediabetes: Secondary | ICD-10-CM | POA: Diagnosis not present

## 2024-02-08 DIAGNOSIS — Z713 Dietary counseling and surveillance: Secondary | ICD-10-CM | POA: Diagnosis not present

## 2024-02-08 DIAGNOSIS — Z79899 Other long term (current) drug therapy: Secondary | ICD-10-CM | POA: Diagnosis not present

## 2024-02-08 DIAGNOSIS — Z6824 Body mass index (BMI) 24.0-24.9, adult: Secondary | ICD-10-CM | POA: Diagnosis not present

## 2024-02-08 DIAGNOSIS — F17200 Nicotine dependence, unspecified, uncomplicated: Secondary | ICD-10-CM | POA: Diagnosis not present

## 2024-02-08 DIAGNOSIS — J4520 Mild intermittent asthma, uncomplicated: Secondary | ICD-10-CM | POA: Diagnosis not present

## 2024-02-08 DIAGNOSIS — R569 Unspecified convulsions: Secondary | ICD-10-CM | POA: Diagnosis not present

## 2024-03-13 DIAGNOSIS — G43909 Migraine, unspecified, not intractable, without status migrainosus: Secondary | ICD-10-CM | POA: Diagnosis not present

## 2024-03-13 DIAGNOSIS — G40909 Epilepsy, unspecified, not intractable, without status epilepticus: Secondary | ICD-10-CM | POA: Diagnosis not present

## 2024-03-13 DIAGNOSIS — R519 Headache, unspecified: Secondary | ICD-10-CM | POA: Diagnosis not present

## 2024-03-13 DIAGNOSIS — F17290 Nicotine dependence, other tobacco product, uncomplicated: Secondary | ICD-10-CM | POA: Diagnosis not present

## 2024-03-13 DIAGNOSIS — Z79899 Other long term (current) drug therapy: Secondary | ICD-10-CM | POA: Diagnosis not present
